# Patient Record
Sex: Male | Born: 1955 | Race: White | Hispanic: No | Marital: Married | Smoking: Never smoker
Health system: Southern US, Community
[De-identification: ages and names within clinical notes are randomized; demographics above are authoritative.]

## PROBLEM LIST (undated history)

## (undated) DIAGNOSIS — E7800 Pure hypercholesterolemia, unspecified: Secondary | ICD-10-CM

## (undated) DIAGNOSIS — I639 Cerebral infarction, unspecified: Secondary | ICD-10-CM

## (undated) DIAGNOSIS — I219 Acute myocardial infarction, unspecified: Secondary | ICD-10-CM

## (undated) DIAGNOSIS — I2510 Atherosclerotic heart disease of native coronary artery without angina pectoris: Secondary | ICD-10-CM

## (undated) DIAGNOSIS — I10 Essential (primary) hypertension: Secondary | ICD-10-CM

## (undated) HISTORY — PX: PACEMAKER IMPLANT: EP1218

## (undated) HISTORY — PX: CORONARY ARTERY BYPASS GRAFT: SHX141

---
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
## 2018-09-12 ENCOUNTER — Emergency Department (HOSPITAL_COMMUNITY)
Admission: EM | Admit: 2018-09-12 | Discharge: 2018-09-12 | Disposition: A | Discharge status: Home / Self Care | Payer: Medicaid Other | Attending: Emergency Medicine | Admitting: Emergency Medicine

## 2018-09-12 ENCOUNTER — Encounter (HOSPITAL_COMMUNITY): Payer: Self-pay

## 2018-09-12 ENCOUNTER — Emergency Department (HOSPITAL_COMMUNITY): Payer: Medicaid Other

## 2018-09-12 DIAGNOSIS — N419 Inflammatory disease of prostate, unspecified: Secondary | ICD-10-CM | POA: Insufficient documentation

## 2018-09-12 DIAGNOSIS — N50811 Right testicular pain: Secondary | ICD-10-CM

## 2018-09-12 DIAGNOSIS — Z951 Presence of aortocoronary bypass graft: Secondary | ICD-10-CM | POA: Diagnosis not present

## 2018-09-12 DIAGNOSIS — R300 Dysuria: Secondary | ICD-10-CM | POA: Diagnosis not present

## 2018-09-12 DIAGNOSIS — I10 Essential (primary) hypertension: Secondary | ICD-10-CM | POA: Diagnosis not present

## 2018-09-12 DIAGNOSIS — R1030 Lower abdominal pain, unspecified: Secondary | ICD-10-CM

## 2018-09-12 DIAGNOSIS — I252 Old myocardial infarction: Secondary | ICD-10-CM | POA: Insufficient documentation

## 2018-09-12 DIAGNOSIS — N433 Hydrocele, unspecified: Secondary | ICD-10-CM

## 2018-09-12 HISTORY — DX: Acute myocardial infarction, unspecified: I21.9

## 2018-09-12 HISTORY — DX: Essential (primary) hypertension: I10

## 2018-09-12 HISTORY — DX: Cerebral infarction, unspecified: I63.9

## 2018-09-12 LAB — CBC WITH DIFFERENTIAL/PLATELET
Abs Immature Granulocytes: 0.04 10*3/uL (ref 0.00–0.07)
BASOS PCT: 0 %
Basophils Absolute: 0 10*3/uL (ref 0.0–0.1)
Eosinophils Absolute: 0.1 10*3/uL (ref 0.0–0.5)
Eosinophils Relative: 1 %
HCT: 40.6 % (ref 39.0–52.0)
Hemoglobin: 13.6 g/dL (ref 13.0–17.0)
Immature Granulocytes: 1 %
LYMPHS ABS: 2.3 10*3/uL (ref 0.7–4.0)
Lymphocytes Relative: 32 %
MCH: 33.7 pg (ref 26.0–34.0)
MCHC: 33.5 g/dL (ref 30.0–36.0)
MCV: 100.5 fL — AB (ref 80.0–100.0)
MONOS PCT: 8 %
Monocytes Absolute: 0.6 10*3/uL (ref 0.1–1.0)
Neutro Abs: 4.2 10*3/uL (ref 1.7–7.7)
Neutrophils Relative %: 58 %
Platelets: 238 10*3/uL (ref 150–400)
RBC: 4.04 MIL/uL — ABNORMAL LOW (ref 4.22–5.81)
RDW: 12.6 % (ref 11.5–15.5)
WBC: 7.3 10*3/uL (ref 4.0–10.5)
nRBC: 0 % (ref 0.0–0.2)

## 2018-09-12 LAB — COMPREHENSIVE METABOLIC PANEL
ALBUMIN: 4.1 g/dL (ref 3.5–5.0)
ALT: 15 U/L (ref 0–44)
ANION GAP: 8 (ref 5–15)
AST: 20 U/L (ref 15–41)
Alkaline Phosphatase: 35 U/L — ABNORMAL LOW (ref 38–126)
BUN: 16 mg/dL (ref 8–23)
CO2: 22 mmol/L (ref 22–32)
Calcium: 9.6 mg/dL (ref 8.9–10.3)
Chloride: 111 mmol/L (ref 98–111)
Creatinine, Ser: 0.96 mg/dL (ref 0.61–1.24)
GFR calc Af Amer: >60 mL/min (ref >60)
GFR calc non Af Amer: >60 mL/min (ref >60)
GLUCOSE: 105 mg/dL — AB (ref 70–99)
Potassium: 3.6 mmol/L (ref 3.5–5.1)
SODIUM: 141 mmol/L (ref 135–145)
Total Bilirubin: 0.6 mg/dL (ref 0.3–1.2)
Total Protein: 6.7 g/dL (ref 6.5–8.1)

## 2018-09-12 LAB — URINALYSIS, ROUTINE W REFLEX MICROSCOPIC
Bilirubin Urine: NEGATIVE
Glucose, UA: NEGATIVE mg/dL
Hgb urine dipstick: NEGATIVE
KETONES UR: NEGATIVE mg/dL
LEUKOCYTES UA: NEGATIVE
Nitrite: NEGATIVE
Protein, ur: NEGATIVE mg/dL
Specific Gravity, Urine: 1.013 (ref 1.005–1.030)
pH: 5 (ref 5.0–8.0)

## 2018-09-12 MED ORDER — CIPROFLOXACIN HCL 500 MG PO TABS
500.0000 mg | ORAL_TABLET | Freq: Once | ORAL | Status: AC
  Administered 2018-09-12: 500 mg via ORAL
  Filled 2018-09-12: qty 1

## 2018-09-12 MED ORDER — CIPROFLOXACIN HCL 500 MG PO TABS: 500.0000 mg | ORAL_TABLET | Freq: Two times a day (BID) | ORAL | 0 refills | Status: AC

## 2018-09-12 NOTE — ED Triage Notes (Signed)
Pt endorses going pain that began yesterday while working. Denies swelling. Endorses burning with urination. VSS.

## 2018-09-12 NOTE — ED Provider Notes (Signed)
[facility] EMERGENCY [facility]PART[facility] Provider Note   CSN: [id number] Arrival date & time: 09/12/18  1541     History   Chief Complaint Chief Complaint  Patient presents with  . Abdominal Pain  . Groin Pain    HPI Chad FusiDouglas Gray is a 62 y.o. male with a PMHx of nephrolithiasis, HTN, HLD, CVA, and MI s/p CABG, who presents to the ED with complaints of groin/lower abdominal pain that began 2 days ago but has worsened today.  He describes the pain as 6/10 constant aching suprapubic pain that radiates to bilateral testicles, worse with movement, unchanged with testicular elevation, and mildly improved with Tylenol.  He reports dribbling urine stream, urinary frequency with sensation of needing to urinate but very little comes out, and dysuria.  He has had kidney stones before but states that this does not feel similar to that.  He denies fevers, chills, CP, SOB, nausea/vomiting, diarrhea/constipation, obstipation, melena, hematochezia, rectal pain, perineal pain, hematuria, testicular swelling, penile discharge, myalgias, arthralgias, numbness, tingling, focal weakness, or any other complaints at this time. Denies recent travel, sick contacts, suspicious food intake, EtOH use, or NSAID use.   The history is provided by the patient and medical records. No language interpreter was used.  Abdominal Pain   Associated symptoms include dysuria and frequency. Pertinent negatives include fever, diarrhea, nausea, vomiting, constipation, hematuria, arthralgias and myalgias.  Groin Pain  Associated symptoms include abdominal pain. Pertinent negatives include no chest pain and no shortness of breath.    Past Medical History:  Diagnosis Date  . Hypertension   . Myocardial infarct (HCC)   . Stroke [facility](HCC)     There are no active problems to display for this patient.   Past Surgical History:  Procedure Laterality Date  . CORONARY ARTERY BYPASS GRAFT          Home Medications     Prior to Admission medications   Not on File    Family History History reviewed. No pertinent family history.  Social History Social History   Tobacco Use  . Smoking status: Never Smoker  Substance Use Topics  . Alcohol use: Never    Frequency: Never  . Drug use: Never     Allergies   Patient has no allergy information on record.   Review of Systems Review of Systems  Constitutional: Negative for chills and fever.  Respiratory: Negative for shortness of breath.   Cardiovascular: Negative for chest pain.  Gastrointestinal: Positive for abdominal pain. Negative for blood in stool, constipation, diarrhea, nausea, rectal pain and vomiting.  Genitourinary: Positive for dysuria, frequency and testicular pain. Negative for discharge, hematuria and scrotal swelling.       +dribbling urine  Musculoskeletal: Negative for arthralgias and myalgias.  Skin: Negative for color change.  Allergic/Immunologic: Negative for immunocompromised state.  Neurological: Negative for weakness and numbness.  Psychiatric/Behavioral: Negative for confusion.   All other systems reviewed and are negative for acute change except as noted in the HPI.    Physical Exam Updated Vital Signs BP (!) 142/88 (BP Location: Right Arm)   Pulse 68   Temp 98 F (36.7 C) (Oral)   Resp 16   SpO2 98%   Physical Exam Vitals signs and nursing note reviewed.  Constitutional:      General: He is not in acute distress.    Appearance: Normal appearance. He is well-developed. He is not toxic-appearing.     Comments: Afebrile, nontoxic, NAD  HENT:     Head:  Normocephalic and atraumatic.  Eyes:     General:        Right eye: No discharge.        Left eye: No discharge.     Conjunctiva/sclera: Conjunctivae normal.  Neck:     Musculoskeletal: Normal range of motion and neck supple.  Cardiovascular:     Rate and Rhythm: Normal rate and regular rhythm.     Heart sounds: Normal heart sounds, S1 normal and S2  normal. No murmur. No friction rub. No gallop.   Pulmonary:     Effort: Pulmonary effort is normal. No respiratory distress.     Breath sounds: Normal breath sounds. No decreased breath sounds, wheezing, rhonchi or rales.  Abdominal:     General: Bowel sounds are normal. There is no distension.     Palpations: Abdomen is soft. Abdomen is not rigid.     Tenderness: There is abdominal tenderness in the suprapubic area. There is no right CVA tenderness, left CVA tenderness, guarding or rebound. Negative signs include Murphy's sign and McBurney's sign.     Hernia: There is no hernia in the right inguinal area or left inguinal area.     Comments: Soft, nondistended, +BS throughout, with mild suprapubic TTP, no r/g/r, neg murphy's, neg mcburney's, no CVA TTP   Genitourinary:    Penis: Normal and circumcised. No phimosis, paraphimosis, hypospadias, erythema, tenderness or discharge.      Scrotum/Testes: Cremasteric reflex is present.        Right: Tenderness present. Mass or swelling not present.        Left: Mass, tenderness or swelling not present.     Comments: Chaperone present for exam Circumcised penis without phimosis/paraphimosis, hypospadias, erythema, tenderness, or discharge. No rashes or lesions. Testes with no masses or swelling, R testicle mildly TTP near the lower pole slightly posteriorly, cremasterics reflex present bilaterally. No abnormal lie. No inguinal hernias or adenopathy present.  Musculoskeletal: Normal range of motion.  Skin:    General: Skin is warm and dry.     Findings: No rash.  Neurological:     Mental Status: He is alert and oriented to person, place, and time.     Sensory: Sensation is intact. No sensory deficit.     Motor: Motor function is intact.  Psychiatric:        Mood and Affect: Mood and affect normal.        Behavior: Behavior normal.      ED Treatments / Results  Labs (all labs ordered are listed, but only abnormal results are displayed) Labs  Reviewed  COMPREHENSIVE METABOLIC PANEL - Abnormal; Notable for the following components:      Result Value   Glucose, Bld 105 (*)    Alkaline Phosphatase 35 (*)    All other components within normal limits  CBC WITH DIFFERENTIAL/PLATELET - Abnormal; Notable for the following components:   RBC 4.04 (*)    MCV 100.5 (*)    All other components within normal limits  URINALYSIS, ROUTINE W REFLEX MICROSCOPIC    EKG None  Radiology US Scrotum W/doppler  Result Date: 09/12/2018 CLINICAL DATA:  Right testicular pain. EXAM: SCROTAL ULTRASOUND DOPPLER ULTRASOUND OF THE TESTICLES TECHNIQUE: Complete ultrasound examination of the testicles, epididymis, and other scrotal structures was performed. Color and spectral Doppler ultrasound were also utilized to evaluate blood flow to the testicles. COMPARISON:  None. FINDINGS: Right testicle Measurements: 4.7 x 3.9 x 2.3 cm. No mass or microlithiasis visualized. Left testicle Measurements: 4.4 x 3.5  x 2.4 cm. No mass or microlithiasis visualized. Right epididymis:  Normal in size and appearance. Left epididymis: 2 cysts are noted, with the largest measuring 1.2 cm. Hydrocele:  Small bilateral hydroceles are noted. Varicocele:  None visualized. Pulsed Doppler interrogation of both testes demonstrates normal low resistance arterial and venous waveforms bilaterally. IMPRESSION: No evidence of testicular mass or torsion. Small bilateral hydroceles are noted. Left epididymal cysts are noted. Electronically Signed   By: Lupita RaiderJames  Green Jr, M.D.   On: 09/12/2018 18:30    Procedures Procedures (including critical care time)  Medications Ordered in ED Medications  ciprofloxacin (CIPRO) tablet 500 mg (500 mg Oral Given 09/12/18 2022)     Initial Impression / Assessment and Plan / ED Course  I have reviewed the triage vital signs and the nursing notes.  Pertinent labs & imaging results that were available during my care of the patient were reviewed by me and  considered in my medical decision making (see chart for details).     62 y.o. male here with lower abdominal/groin pain for the last 2 days worse today.  On exam, very mild suprapubic tenderness, mild right testicular tenderness without swelling, no abnormal lie, cremasterics present bilaterally.  No hernias palpable.  No CVA tenderness.  No other abdominal tenderness.  Differential includes testicular torsion, epididymitis, orchitis, etc.  We will get labs, urine, and ultrasound of scrotum.  Doubt kidney stone. Pt declines wanting anything for pain, will reassess shortly.   8:26 PM CBC w/diff WNL. CMP WNL. U/A unremarkable. US Scrotum w/doppler without evidence of testicular mass or torsion, small b/l hydroceles noted, and L epididymal cysts noted. Post-void residual bladder scan 30mL. Overall symptoms c/w prostatitis, will treat as such. Will send home with cipro, advised tylenol/motrin use, and f/up with urology in 1wk for recheck. I explained the diagnosis and have given explicit precautions to return to the ER including for any other new or worsening symptoms. The patient understands and accepts the medical plan as it's been dictated and I have answered their questions. Discharge instructions concerning home care and prescriptions have been given. The patient is STABLE and is discharged to home in good condition.    Final Clinical Impressions(s) / ED Diagnoses   Final diagnoses:  Testicular pain, right  Lower abdominal pain  Dysuria  Prostatitis, unspecified prostatitis type  Hydrocele of testis    ED Discharge Orders         Ordered    ciprofloxacin (CIPRO) 500 MG tablet  2 times daily     09/12/18 [address]           Rashonda Warrior, JewettMercedes, Cordelia Poche-C 09/12/18 2026    Azalia Bilisampos, Kevin, MD 09/13/18 [phone]

## 2018-09-12 NOTE — Discharge Instructions (Signed)
Stay well hydrated, get plenty of rest. Take antibiotic as directed until completed. Alternate between tylenol and ibuprofen as needed for pain. Follow up with the urologist in 1 week for recheck of symptoms. Return to the ER for emergent changes or worsening symptoms.

## 2018-09-17 ENCOUNTER — Encounter (HOSPITAL_COMMUNITY): Payer: Self-pay | Admitting: Emergency Medicine

## 2018-09-17 ENCOUNTER — Emergency Department (HOSPITAL_COMMUNITY): Payer: Medicaid Other

## 2018-09-17 ENCOUNTER — Emergency Department (HOSPITAL_COMMUNITY)
Admission: EM | Admit: 2018-09-17 | Discharge: 2018-09-17 | Disposition: A | Discharge status: Home / Self Care | Payer: Medicaid Other | Attending: Emergency Medicine | Admitting: Emergency Medicine

## 2018-09-17 ENCOUNTER — Other Ambulatory Visit: Payer: Self-pay

## 2018-09-17 DIAGNOSIS — Z950 Presence of cardiac pacemaker: Secondary | ICD-10-CM | POA: Insufficient documentation

## 2018-09-17 DIAGNOSIS — I10 Essential (primary) hypertension: Secondary | ICD-10-CM | POA: Diagnosis not present

## 2018-09-17 DIAGNOSIS — R05 Cough: Secondary | ICD-10-CM | POA: Diagnosis present

## 2018-09-17 DIAGNOSIS — Z951 Presence of aortocoronary bypass graft: Secondary | ICD-10-CM | POA: Insufficient documentation

## 2018-09-17 DIAGNOSIS — J069 Acute upper respiratory infection, unspecified: Secondary | ICD-10-CM

## 2018-09-17 HISTORY — DX: Pure hypercholesterolemia, unspecified: E78.00

## 2018-09-17 MED ORDER — CETIRIZINE HCL 10 MG PO TABS: 10.0000 mg | ORAL_TABLET | Freq: Every day | ORAL | 0 refills | Status: AC

## 2018-09-17 MED ORDER — FLUTICASONE PROPIONATE 50 MCG/ACT NA SUSP: 2.0000 | Freq: Every day | NASAL | 0 refills | Status: AC

## 2018-09-17 NOTE — ED Notes (Signed)
Patient transported to X-ray 

## 2018-09-17 NOTE — ED Notes (Signed)
Pt back from X-ray.  

## 2018-09-17 NOTE — Discharge Instructions (Signed)
Your chest x-ray did not show any sign of pneumonia.  Drink plenty fluids and get plenty of rest.  Take over-the-counter medicines for your symptoms.  You can take Flonase as needed for nasal congestion.  You can also take allergy medicine such as Zyrtec or Allegra for nasal congestion and scratchy throat.  Follow-up with primary care doctor if symptoms persist.  Return to the emergency department if any concerning signs or symptoms develop such as high fevers, shortness of breath, chest pain, or persistent vomiting.

## 2018-09-17 NOTE — ED Triage Notes (Signed)
Pt reports cough, congestion, body aches, chills. Pt reports fever, but has not measured his temp afebrile here. Unsure of sick contact. Pt reports symptoms x 2-3 days.

## 2018-09-17 NOTE — ED Provider Notes (Signed)
[facility] EMERGENCY DEPARTMENT Provider Note   CSN: [id number] Arrival date & time: 09/17/18  1703     History   Chief Complaint Chief Complaint  Patient presents with  . Influenza    HPI Chad Gray is a 63 y.o. male with history of hyperlipidemia, hypertension, prior MI and stroke presenting for evaluation of acute onset, persistent nasal congestion and dry cough for 2 days.  He notes subjective fevers and chills and sinus pressure.  Denies sore throat but endorses scratchy throat.  Denies shortness of breath or chest pain.  Was seen recently for lower abdominal pain and diagnosed with prostatitis and has been taking Cipro as prescribed with significant improvement in those symptoms.  No nausea or vomiting.  He has tried over-the-counter medications with some relief.  The history is provided by the patient.    Past Medical History:  Diagnosis Date  . Hypercholesteremia   . Hypertension   . Myocardial infarct (HCC)   . Stroke [facility])     There are no active problems to display for this patient.   Past Surgical History:  Procedure Laterality Date  . CORONARY ARTERY BYPASS GRAFT    . PACEMAKER IMPLANT          Home Medications    Prior to Admission medications   Medication Sig Start Date End Date Taking? Authorizing Provider  cetirizine (ZYRTEC ALLERGY) 10 MG tablet Take 1 tablet (10 mg total) by mouth daily. 09/17/18   Keevin Panebianco A, PA-C  ciprofloxacin (CIPRO) 500 MG tablet Take 1 tablet (500 mg total) by mouth 2 (two) times daily for 14 days. 09/12/18 09/26/18  Street, Mercedes, PA-C  fluticasone (FLONASE) 50 MCG/ACT nasal spray Place 2 sprays into both nostrils daily. 09/17/18   Jeanie Sewer, PA-C    Family History History reviewed. No pertinent family history.  Social History Social History   Tobacco Use  . Smoking status: Never Smoker  . Smokeless tobacco: Never Used  Substance Use Topics  . Alcohol use: Never    Frequency: Never    . Drug use: Never     Allergies   Codeine   Review of Systems Review of Systems  Constitutional: Positive for chills and fever.  HENT: Positive for congestion. Negative for sore throat.   Respiratory: Positive for cough. Negative for shortness of breath.   Cardiovascular: Negative for chest pain.  Gastrointestinal: Negative for abdominal pain, nausea and vomiting.     Physical Exam Updated Vital Signs BP (!) 142/79   Pulse 68   Temp (!) 97.4 F (36.3 C) (Oral)   Resp 16   SpO2 98%   Physical Exam Vitals signs and nursing note reviewed.  Constitutional:      General: He is not in acute distress.    Appearance: He is well-developed.  HENT:     Head: Normocephalic and atraumatic.     Right Ear: Tympanic membrane, ear canal and external ear normal.     Left Ear: Tympanic membrane, ear canal and external ear normal.     Nose: Congestion present.     Comments: No frontal or maxillary sinus tenderness    Mouth/Throat:     Mouth: Mucous membranes are moist.     Pharynx: Oropharynx is clear. No oropharyngeal exudate.     Comments: Tolerating secretions without difficulty.  No tonsillar hypertrophy, exudates, uvular deviation, trismus, or sublingual abnormalities Eyes:     General:        Right eye: No discharge.  Left eye: No discharge.     Conjunctiva/sclera: Conjunctivae normal.  Neck:     Musculoskeletal: Normal range of motion. No neck rigidity.     Vascular: No JVD.     Trachea: No tracheal deviation.  Cardiovascular:     Rate and Rhythm: Normal rate.     Heart sounds: Normal heart sounds.  Pulmonary:     Effort: Pulmonary effort is normal.     Breath sounds: Normal breath sounds.  Chest:     Chest wall: No tenderness.  Abdominal:     General: Bowel sounds are normal. There is no distension.     Tenderness: There is no abdominal tenderness. There is no guarding.  Lymphadenopathy:     Cervical: No cervical adenopathy.  Skin:    Findings: No  erythema.  Neurological:     Mental Status: He is alert.  Psychiatric:        Behavior: Behavior normal.      ED Treatments / Results  Labs (all labs ordered are listed, but only abnormal results are displayed) Labs Reviewed - No data to display  EKG None  Radiology Dg Chest 2 View  Result Date: 09/17/2018 CLINICAL DATA:  63 y/o M; body aches, nasal/sinus congestion, 2 days of headaches. EXAM: CHEST - 2 VIEW COMPARISON:  None. FINDINGS: Normal cardiac silhouette. Aortic atherosclerosis with calcification. Two lead pacemaker. Status post CABG with sternotomy wires in alignment. No pleural effusion or pneumothorax. No acute osseous abnormality is evident. IMPRESSION: No acute pulmonary process identified. Electronically Signed   By: Mitzi HansenLance  Furusawa-Stratton M.D.   On: 09/17/2018 20:10    Procedures Procedures (including critical care time)  Medications Ordered in ED Medications - No data to display   Initial Impression / Assessment and Plan / ED Course  I have reviewed the triage vital signs and the nursing notes.  Pertinent labs & imaging results that were available during my care of the patient were reviewed by me and considered in my medical decision making (see chart for details).     Patient presenting for evaluation of nasal congestion, nonproductive cough for 2 days.  Denies chest pain or shortness of breath.  He is afebrile, vital signs stable.  He is nontoxic in appearance.  Symptoms sound infectious in etiology, low suspicion of ACS/MI, PE, or dissection.  We discussed the utility of Tamiflu and he would not want treatment for this even if he did test positive for influenza so we will manage his therapy symptomatically.  Chest x-ray shows no evidence of acute cardiopulmonary abnormality.  No evidence of pneumonia or pleural effusion.  Recommend follow-up with PCP for reevaluation of symptoms.  Discussed strict ED return precautions. Pt verbalized understanding of and  agreement with plan and is safe for discharge home at this time.   Final Clinical Impressions(s) / ED Diagnoses   Final diagnoses:  URI with cough and congestion    ED Discharge Orders         Ordered    fluticasone (FLONASE) 50 MCG/ACT nasal spray  Daily     09/17/18 2020    cetirizine (ZYRTEC ALLERGY) 10 MG tablet  Daily     09/17/18 2020           Jeanie SewerFawze, Bryden Darden A, PA-C 09/18/18 1505    Rolan BuccoBelfi, Melanie, MD 09/19/18 0800

## 2018-09-17 NOTE — ED Notes (Signed)
Discharge instructions and prescriptions discussed with Pt. Pt verbalized understanding. Pt stable and ambulatory.   

## 2020-06-07 ENCOUNTER — Emergency Department (HOSPITAL_COMMUNITY): Payer: Medicare Other | Admitting: Certified Registered Nurse Anesthetist

## 2020-06-07 ENCOUNTER — Emergency Department (HOSPITAL_COMMUNITY): Payer: Medicare Other

## 2020-06-07 ENCOUNTER — Encounter (HOSPITAL_COMMUNITY): Payer: Self-pay | Admitting: Emergency Medicine

## 2020-06-07 ENCOUNTER — Inpatient Hospital Stay (HOSPITAL_COMMUNITY)
Admission: EM | Admit: 2020-06-07 | Discharge: 2020-06-23 | DRG: 026 | Disposition: A | Discharge status: Skilled Nursing Facility | Payer: Medicare Other | Attending: General Surgery | Admitting: General Surgery

## 2020-06-07 ENCOUNTER — Inpatient Hospital Stay (HOSPITAL_COMMUNITY): Admission: EM | Disposition: A | Discharge status: Skilled Nursing Facility | Payer: Self-pay | Source: Home / Self Care

## 2020-06-07 DIAGNOSIS — Z4659 Encounter for fitting and adjustment of other gastrointestinal appliance and device: Secondary | ICD-10-CM

## 2020-06-07 DIAGNOSIS — S0990XA Unspecified injury of head, initial encounter: Secondary | ICD-10-CM

## 2020-06-07 DIAGNOSIS — Z950 Presence of cardiac pacemaker: Secondary | ICD-10-CM | POA: Diagnosis not present

## 2020-06-07 DIAGNOSIS — S064X9A Epidural hemorrhage with loss of consciousness of unspecified duration, initial encounter: Secondary | ICD-10-CM

## 2020-06-07 DIAGNOSIS — S066X0A Traumatic subarachnoid hemorrhage without loss of consciousness, initial encounter: Secondary | ICD-10-CM | POA: Diagnosis present

## 2020-06-07 DIAGNOSIS — D62 Acute posthemorrhagic anemia: Secondary | ICD-10-CM | POA: Diagnosis not present

## 2020-06-07 DIAGNOSIS — W2111XD Struck by baseball bat, subsequent encounter: Secondary | ICD-10-CM | POA: Diagnosis not present

## 2020-06-07 DIAGNOSIS — I10 Essential (primary) hypertension: Secondary | ICD-10-CM | POA: Diagnosis not present

## 2020-06-07 DIAGNOSIS — Z951 Presence of aortocoronary bypass graft: Secondary | ICD-10-CM

## 2020-06-07 DIAGNOSIS — Z9889 Other specified postprocedural states: Secondary | ICD-10-CM

## 2020-06-07 DIAGNOSIS — R0602 Shortness of breath: Secondary | ICD-10-CM

## 2020-06-07 DIAGNOSIS — I2510 Atherosclerotic heart disease of native coronary artery without angina pectoris: Secondary | ICD-10-CM | POA: Diagnosis not present

## 2020-06-07 DIAGNOSIS — R402232 Coma scale, best verbal response, inappropriate words, at arrival to emergency department: Secondary | ICD-10-CM | POA: Diagnosis present

## 2020-06-07 DIAGNOSIS — I69320 Aphasia following cerebral infarction: Secondary | ICD-10-CM | POA: Diagnosis not present

## 2020-06-07 DIAGNOSIS — I959 Hypotension, unspecified: Secondary | ICD-10-CM | POA: Diagnosis not present

## 2020-06-07 DIAGNOSIS — R402342 Coma scale, best motor response, flexion withdrawal, at arrival to emergency department: Secondary | ICD-10-CM | POA: Diagnosis present

## 2020-06-07 DIAGNOSIS — Z20822 Contact with and (suspected) exposure to covid-19: Secondary | ICD-10-CM | POA: Diagnosis not present

## 2020-06-07 DIAGNOSIS — Z7902 Long term (current) use of antithrombotics/antiplatelets: Secondary | ICD-10-CM | POA: Diagnosis not present

## 2020-06-07 DIAGNOSIS — S0101XD Laceration without foreign body of scalp, subsequent encounter: Secondary | ICD-10-CM | POA: Diagnosis present

## 2020-06-07 DIAGNOSIS — Z79899 Other long term (current) drug therapy: Secondary | ICD-10-CM | POA: Diagnosis not present

## 2020-06-07 DIAGNOSIS — E876 Hypokalemia: Secondary | ICD-10-CM | POA: Diagnosis not present

## 2020-06-07 DIAGNOSIS — G8191 Hemiplegia, unspecified affecting right dominant side: Secondary | ICD-10-CM | POA: Diagnosis not present

## 2020-06-07 DIAGNOSIS — R402142 Coma scale, eyes open, spontaneous, at arrival to emergency department: Secondary | ICD-10-CM | POA: Diagnosis present

## 2020-06-07 DIAGNOSIS — Z885 Allergy status to narcotic agent status: Secondary | ICD-10-CM

## 2020-06-07 DIAGNOSIS — I609 Nontraumatic subarachnoid hemorrhage, unspecified: Secondary | ICD-10-CM | POA: Diagnosis not present

## 2020-06-07 DIAGNOSIS — K5900 Constipation, unspecified: Secondary | ICD-10-CM | POA: Diagnosis not present

## 2020-06-07 DIAGNOSIS — F6381 Intermittent explosive disorder: Secondary | ICD-10-CM | POA: Diagnosis not present

## 2020-06-07 DIAGNOSIS — S064X0A Epidural hemorrhage without loss of consciousness, initial encounter: Secondary | ICD-10-CM | POA: Diagnosis not present

## 2020-06-07 DIAGNOSIS — S020XXB Fracture of vault of skull, initial encounter for open fracture: Secondary | ICD-10-CM | POA: Diagnosis not present

## 2020-06-07 DIAGNOSIS — Z23 Encounter for immunization: Secondary | ICD-10-CM

## 2020-06-07 DIAGNOSIS — S064XAA Epidural hemorrhage with loss of consciousness status unknown, initial encounter: Secondary | ICD-10-CM

## 2020-06-07 DIAGNOSIS — Z87820 Personal history of traumatic brain injury: Secondary | ICD-10-CM | POA: Diagnosis not present

## 2020-06-07 DIAGNOSIS — Z4802 Encounter for removal of sutures: Secondary | ICD-10-CM | POA: Diagnosis not present

## 2020-06-07 HISTORY — DX: Unspecified injury of head, initial encounter: S09.90XA

## 2020-06-07 HISTORY — DX: Atherosclerotic heart disease of native coronary artery without angina pectoris: I25.10

## 2020-06-07 HISTORY — PX: CRANIOTOMY: SHX93

## 2020-06-07 LAB — I-STAT CHEM 8, ED
BUN: 10 mg/dL (ref 8–23)
Calcium, Ion: 1.27 mmol/L (ref 1.15–1.40)
Chloride: 106 mmol/L (ref 98–111)
Creatinine, Ser: 1.1 mg/dL (ref 0.61–1.24)
Glucose, Bld: 104 mg/dL — ABNORMAL HIGH (ref 70–99)
HCT: 39 % (ref 39.0–52.0)
Hemoglobin: 13.3 g/dL (ref 13.0–17.0)
Potassium: 3.9 mmol/L (ref 3.5–5.1)
Sodium: 140 mmol/L (ref 135–145)
TCO2: 24 mmol/L (ref 22–32)

## 2020-06-07 LAB — SARS CORONAVIRUS 2 BY RT PCR (HOSPITAL ORDER, PERFORMED IN ~~LOC~~ HOSPITAL LAB): SARS Coronavirus 2: NEGATIVE

## 2020-06-07 LAB — PROTIME-INR
INR: 1.1 (ref 0.8–1.2)
Prothrombin Time: 13.3 seconds (ref 11.4–15.2)

## 2020-06-07 LAB — COMPREHENSIVE METABOLIC PANEL
ALT: 15 U/L (ref 0–44)
AST: 19 U/L (ref 15–41)
Albumin: 4 g/dL (ref 3.5–5.0)
Alkaline Phosphatase: 33 U/L — ABNORMAL LOW (ref 38–126)
Anion gap: 7 (ref 5–15)
BUN: 9 mg/dL (ref 8–23)
CO2: 21 mmol/L — ABNORMAL LOW (ref 22–32)
Calcium: 9.5 mg/dL (ref 8.9–10.3)
Chloride: 109 mmol/L (ref 98–111)
Creatinine, Ser: 1.12 mg/dL (ref 0.61–1.24)
GFR calc Af Amer: >60 mL/min (ref >60)
GFR calc non Af Amer: >60 mL/min (ref >60)
Glucose, Bld: 108 mg/dL — ABNORMAL HIGH (ref 70–99)
Potassium: 3.9 mmol/L (ref 3.5–5.1)
Sodium: 137 mmol/L (ref 135–145)
Total Bilirubin: 0.9 mg/dL (ref 0.3–1.2)
Total Protein: 6.3 g/dL — ABNORMAL LOW (ref 6.5–8.1)

## 2020-06-07 LAB — CBC
HCT: 40 % (ref 39.0–52.0)
Hemoglobin: 13.2 g/dL (ref 13.0–17.0)
MCH: 34.1 pg — ABNORMAL HIGH (ref 26.0–34.0)
MCHC: 33 g/dL (ref 30.0–36.0)
MCV: 103.4 fL — ABNORMAL HIGH (ref 80.0–100.0)
Platelets: 239 10*3/uL (ref 150–400)
RBC: 3.87 MIL/uL — ABNORMAL LOW (ref 4.22–5.81)
RDW: 12.8 % (ref 11.5–15.5)
WBC: 11 10*3/uL — ABNORMAL HIGH (ref 4.0–10.5)
nRBC: 0 % (ref 0.0–0.2)

## 2020-06-07 LAB — TYPE AND SCREEN
ABO/RH(D): A POS
Antibody Screen: NEGATIVE

## 2020-06-07 LAB — SAMPLE TO BLOOD BANK

## 2020-06-07 LAB — ETHANOL: Alcohol, Ethyl (B): <10 mg/dL (ref <10)

## 2020-06-07 LAB — ABO/RH: ABO/RH(D): A POS

## 2020-06-07 LAB — LACTIC ACID, PLASMA: Lactic Acid, Venous: 1.5 mmol/L (ref 0.5–1.9)

## 2020-06-07 LAB — MRSA PCR SCREENING: MRSA by PCR: POSITIVE — AB

## 2020-06-07 SURGERY — CRANIOTOMY HEMATOMA EVACUATION EPIDURAL
Anesthesia: General | Laterality: Left

## 2020-06-07 MED ORDER — TETANUS-DIPHTH-ACELL PERTUSSIS 5-2.5-18.5 LF-MCG/0.5 IM SUSP
0.5000 mL | Freq: Once | INTRAMUSCULAR | Status: AC
  Administered 2020-06-07: 0.5 mL via INTRAMUSCULAR

## 2020-06-07 MED ORDER — DEXAMETHASONE SODIUM PHOSPHATE 10 MG/ML IJ SOLN
INTRAMUSCULAR | Status: DC | PRN
  Administered 2020-06-07: 10 mg via INTRAVENOUS

## 2020-06-07 MED ORDER — THIAMINE HCL 100 MG PO TABS: 100.0000 mg | ORAL_TABLET | Freq: Every day | ORAL | Status: DC

## 2020-06-07 MED ORDER — FENTANYL CITRATE (PF) 100 MCG/2ML IJ SOLN
INTRAMUSCULAR | Status: AC
Start: 2020-06-07 — End: 2020-06-08
  Filled 2020-06-07: qty 2

## 2020-06-07 MED ORDER — ONDANSETRON HCL 4 MG/2ML IJ SOLN: 4.0000 mg | Freq: Four times a day (QID) | INTRAMUSCULAR | Status: DC | PRN

## 2020-06-07 MED ORDER — BACITRACIN ZINC 500 UNIT/GM EX OINT
TOPICAL_OINTMENT | CUTANEOUS | Status: AC
  Filled 2020-06-07: qty 28.35

## 2020-06-07 MED ORDER — POLYETHYLENE GLYCOL 3350 17 G PO PACK: 17.0000 g | PACK | Freq: Every day | ORAL | Status: DC

## 2020-06-07 MED ORDER — LACTATED RINGERS IV SOLN: INTRAVENOUS | Status: DC

## 2020-06-07 MED ORDER — SUCCINYLCHOLINE CHLORIDE 200 MG/10ML IV SOSY
PREFILLED_SYRINGE | INTRAVENOUS | Status: DC | PRN
  Administered 2020-06-07: 120 mg via INTRAVENOUS

## 2020-06-07 MED ORDER — THROMBIN 20000 UNITS EX SOLR
CUTANEOUS | Status: AC
  Filled 2020-06-07: qty 20000

## 2020-06-07 MED ORDER — 0.9 % SODIUM CHLORIDE (POUR BTL) OPTIME
TOPICAL | Status: DC | PRN
  Administered 2020-06-07: 1000 mL

## 2020-06-07 MED ORDER — THROMBIN 20000 UNITS EX SOLR
CUTANEOUS | Status: DC | PRN
  Administered 2020-06-07: 20 mL via TOPICAL

## 2020-06-07 MED ORDER — LACTATED RINGERS IV SOLN: INTRAVENOUS | Status: DC | PRN

## 2020-06-07 MED ORDER — DEXMEDETOMIDINE HCL IN NACL 200 MCG/50ML IV SOLN
0.4000 ug/kg/h | INTRAVENOUS | Status: DC
  Filled 2020-06-07: qty 50

## 2020-06-07 MED ORDER — SODIUM CHLORIDE 0.9 % IV SOLN
2.0000 g | Freq: Once | INTRAVENOUS | Status: AC
  Administered 2020-06-07: 2 g via INTRAVENOUS
  Filled 2020-06-07: qty 20

## 2020-06-07 MED ORDER — CEFAZOLIN SODIUM-DEXTROSE 2-4 GM/100ML-% IV SOLN: 2.0000 g | Freq: Once | INTRAVENOUS | Status: DC

## 2020-06-07 MED ORDER — SODIUM CHLORIDE 0.9 % IV SOLN: INTRAVENOUS | Status: DC | PRN

## 2020-06-07 MED ORDER — ADULT MULTIVITAMIN W/MINERALS CH: 1.0000 | ORAL_TABLET | Freq: Every day | ORAL | Status: DC

## 2020-06-07 MED ORDER — SUGAMMADEX SODIUM 200 MG/2ML IV SOLN
INTRAVENOUS | Status: DC | PRN
  Administered 2020-06-07: 300 mg via INTRAVENOUS

## 2020-06-07 MED ORDER — LIDOCAINE-EPINEPHRINE 1 %-1:100000 IJ SOLN
INTRAMUSCULAR | Status: AC
  Filled 2020-06-07: qty 1

## 2020-06-07 MED ORDER — PROPOFOL 10 MG/ML IV BOLUS
INTRAVENOUS | Status: DC | PRN
  Administered 2020-06-07: 50 mg via INTRAVENOUS
  Administered 2020-06-07: 150 mg via INTRAVENOUS
  Administered 2020-06-07: 100 mg via INTRAVENOUS
  Administered 2020-06-07: 50 mg via INTRAVENOUS

## 2020-06-07 MED ORDER — SODIUM CHLORIDE 0.9% IV SOLUTION: Freq: Once | INTRAVENOUS | Status: DC

## 2020-06-07 MED ORDER — NOREPINEPHRINE 4 MG/250ML-% IV SOLN
INTRAVENOUS | Status: AC
  Administered 2020-06-07: 4 mg
  Filled 2020-06-07: qty 250

## 2020-06-07 MED ORDER — THIAMINE HCL 100 MG/ML IJ SOLN: 100.0000 mg | Freq: Every day | INTRAMUSCULAR | Status: DC

## 2020-06-07 MED ORDER — FENTANYL 2500MCG IN NS 250ML (10MCG/ML) PREMIX INFUSION: 25.0000 ug/h | INTRAVENOUS | Status: DC

## 2020-06-07 MED ORDER — CEFAZOLIN SODIUM-DEXTROSE 2-3 GM-%(50ML) IV SOLR
INTRAVENOUS | Status: DC | PRN
  Administered 2020-06-07: 2 g via INTRAVENOUS

## 2020-06-07 MED ORDER — FOLIC ACID 1 MG PO TABS: 1.0000 mg | ORAL_TABLET | Freq: Every day | ORAL | Status: DC

## 2020-06-07 MED ORDER — CLEVIDIPINE BUTYRATE 0.5 MG/ML IV EMUL
0.0000 mg/h | INTRAVENOUS | Status: DC
  Administered 2020-06-07: 1 mg/h via INTRAVENOUS
  Filled 2020-06-07 (×2): qty 50

## 2020-06-07 MED ORDER — IOHEXOL 300 MG/ML  SOLN
100.0000 mL | Freq: Once | INTRAMUSCULAR | Status: AC | PRN
  Administered 2020-06-07: 100 mL via INTRAVENOUS

## 2020-06-07 MED ORDER — DEXMEDETOMIDINE HCL IN NACL 400 MCG/100ML IV SOLN
0.4000 ug/kg/h | INTRAVENOUS | Status: DC
  Administered 2020-06-07: 0.6 ug/kg/h via INTRAVENOUS
  Administered 2020-06-07 – 2020-06-08 (×2): 0.4 ug/kg/h via INTRAVENOUS
  Filled 2020-06-07 (×2): qty 100

## 2020-06-07 MED ORDER — LORAZEPAM 1 MG PO TABS: 1.0000 mg | ORAL_TABLET | ORAL | Status: DC | PRN

## 2020-06-07 MED ORDER — LIDOCAINE-EPINEPHRINE 1 %-1:100000 IJ SOLN
20.0000 mL | Freq: Once | INTRAMUSCULAR | Status: AC
  Administered 2020-06-07: 20 mL

## 2020-06-07 MED ORDER — FENTANYL CITRATE (PF) 250 MCG/5ML IJ SOLN
INTRAMUSCULAR | Status: DC | PRN
Start: 2020-06-07 — End: 2020-06-07
  Administered 2020-06-07: 100 ug via INTRAVENOUS
  Administered 2020-06-07: 150 ug via INTRAVENOUS

## 2020-06-07 MED ORDER — DEXMEDETOMIDINE HCL IN NACL 80 MCG/20ML IV SOLN
INTRAVENOUS | Status: AC
  Filled 2020-06-07: qty 20

## 2020-06-07 MED ORDER — THROMBIN (RECOMBINANT) 5000 UNITS EX SOLR
CUTANEOUS | Status: AC
  Filled 2020-06-07: qty 5000

## 2020-06-07 MED ORDER — PROPOFOL 10 MG/ML IV BOLUS
INTRAVENOUS | Status: AC
  Filled 2020-06-07: qty 20

## 2020-06-07 MED ORDER — FENTANYL CITRATE (PF) 250 MCG/5ML IJ SOLN
INTRAMUSCULAR | Status: AC
  Filled 2020-06-07: qty 5

## 2020-06-07 MED ORDER — ONDANSETRON HCL 4 MG/2ML IJ SOLN
INTRAMUSCULAR | Status: DC | PRN
  Administered 2020-06-07: 4 mg via INTRAVENOUS

## 2020-06-07 MED ORDER — FENTANYL CITRATE (PF) 100 MCG/2ML IJ SOLN
25.0000 ug | INTRAMUSCULAR | Status: DC | PRN
  Administered 2020-06-07: 50 ug via INTRAVENOUS

## 2020-06-07 MED ORDER — CEFAZOLIN SODIUM-DEXTROSE 1-4 GM/50ML-% IV SOLN
1.0000 g | Freq: Three times a day (TID) | INTRAVENOUS | Status: DC
  Administered 2020-06-08 – 2020-06-09 (×4): 1 g via INTRAVENOUS
  Filled 2020-06-07 (×4): qty 50

## 2020-06-07 MED ORDER — ONDANSETRON HCL 4 MG/2ML IJ SOLN
INTRAMUSCULAR | Status: AC
  Filled 2020-06-07: qty 2

## 2020-06-07 MED ORDER — DEXAMETHASONE SODIUM PHOSPHATE 10 MG/ML IJ SOLN
INTRAMUSCULAR | Status: AC
  Filled 2020-06-07: qty 1

## 2020-06-07 MED ORDER — HYDRALAZINE HCL 20 MG/ML IJ SOLN
10.0000 mg | INTRAMUSCULAR | Status: DC | PRN
  Administered 2020-06-09: 10 mg via INTRAVENOUS
  Filled 2020-06-07: qty 1

## 2020-06-07 MED ORDER — ROCURONIUM BROMIDE 10 MG/ML (PF) SYRINGE
PREFILLED_SYRINGE | INTRAVENOUS | Status: DC | PRN
  Administered 2020-06-07: 70 mg via INTRAVENOUS
  Administered 2020-06-07: 10 mg via INTRAVENOUS

## 2020-06-07 MED ORDER — FENTANYL BOLUS VIA INFUSION
25.0000 ug | INTRAVENOUS | Status: DC | PRN
  Filled 2020-06-07: qty 25

## 2020-06-07 MED ORDER — LIDOCAINE 2% (20 MG/ML) 5 ML SYRINGE
INTRAMUSCULAR | Status: DC | PRN
  Administered 2020-06-07: 40 mg via INTRAVENOUS

## 2020-06-07 MED ORDER — NOREPINEPHRINE 4 MG/250ML-% IV SOLN
0.0000 ug/min | INTRAVENOUS | Status: DC
  Administered 2020-06-08: 10 ug/min via INTRAVENOUS
  Administered 2020-06-08: 2 ug/min via INTRAVENOUS

## 2020-06-07 MED ORDER — LIDOCAINE-EPINEPHRINE 1 %-1:100000 IJ SOLN
INTRAMUSCULAR | Status: DC | PRN
  Administered 2020-06-07: 10 mL

## 2020-06-07 MED ORDER — PROPOFOL 1000 MG/100ML IV EMUL: 0.0000 ug/kg/min | INTRAVENOUS | Status: DC

## 2020-06-07 MED ORDER — FENTANYL CITRATE (PF) 100 MCG/2ML IJ SOLN: 25.0000 ug | Freq: Once | INTRAMUSCULAR | Status: DC

## 2020-06-07 MED ORDER — THROMBIN 5000 UNITS EX SOLR
CUTANEOUS | Status: AC
  Filled 2020-06-07: qty 5000

## 2020-06-07 MED ORDER — THROMBIN 5000 UNITS EX SOLR: OROMUCOSAL | Status: DC | PRN

## 2020-06-07 MED ORDER — LORAZEPAM 2 MG/ML IJ SOLN: 1.0000 mg | INTRAMUSCULAR | Status: DC | PRN

## 2020-06-07 MED ORDER — DOCUSATE SODIUM 50 MG/5ML PO LIQD
100.0000 mg | Freq: Two times a day (BID) | ORAL | Status: DC
  Filled 2020-06-07: qty 10

## 2020-06-07 MED ORDER — ONDANSETRON HCL 4 MG/2ML IJ SOLN
4.0000 mg | Freq: Four times a day (QID) | INTRAMUSCULAR | Status: DC | PRN
  Administered 2020-06-07: 4 mg via INTRAVENOUS
  Filled 2020-06-07: qty 2

## 2020-06-07 MED ORDER — LABETALOL HCL 5 MG/ML IV SOLN
INTRAVENOUS | Status: DC | PRN
  Administered 2020-06-07 (×2): 5 mg via INTRAVENOUS

## 2020-06-07 MED ORDER — ONDANSETRON 4 MG PO TBDP: 4.0000 mg | ORAL_TABLET | Freq: Four times a day (QID) | ORAL | Status: DC | PRN

## 2020-06-07 SURGICAL SUPPLY — 95 items
ADH SKN CLS APL DERMABOND .7 (GAUZE/BANDAGES/DRESSINGS)
BAG DECANTER FOR FLEXI CONT (MISCELLANEOUS) ×2 IMPLANT
BIT DRILL WIRE PASS 1.3MM (BIT) IMPLANT
BLADE CLIPPER SURG (BLADE) ×4 IMPLANT
BLADE SURG 11 STRL SS (BLADE) ×2 IMPLANT
BNDG CMPR 75X41 PLY HI ABS (GAUZE/BANDAGES/DRESSINGS)
BNDG COHESIVE 4X5 TAN STRL (GAUZE/BANDAGES/DRESSINGS) IMPLANT
BNDG STRETCH 4X75 STRL LF (GAUZE/BANDAGES/DRESSINGS) IMPLANT
BUR ACORN 9.0 PRECISION (BURR) ×2 IMPLANT
BUR SPIRAL ROUTER 2.3 (BUR) ×2 IMPLANT
CABLE BIPOLOR RESECTION CORD (MISCELLANEOUS) IMPLANT
CANISTER SUCT 3000ML PPV (MISCELLANEOUS) ×2 IMPLANT
CARTRIDGE OIL MAESTRO DRILL (MISCELLANEOUS) ×1 IMPLANT
CLIP VESOCCLUDE MED 6/CT (CLIP) IMPLANT
COVER BACK TABLE 60X90IN (DRAPES) IMPLANT
COVER WAND RF STERILE (DRAPES) ×2 IMPLANT
DECANTER SPIKE VIAL GLASS SM (MISCELLANEOUS) ×2 IMPLANT
DERMABOND ADVANCED (GAUZE/BANDAGES/DRESSINGS)
DERMABOND ADVANCED .7 DNX12 (GAUZE/BANDAGES/DRESSINGS) IMPLANT
DIFFUSER DRILL AIR PNEUMATIC (MISCELLANEOUS) ×2 IMPLANT
DRAIN SNY 7 FPER (WOUND CARE) ×2 IMPLANT
DRAPE NEUROLOGICAL W/INCISE (DRAPES) ×2 IMPLANT
DRAPE SURG 17X23 STRL (DRAPES) IMPLANT
DRAPE WARM FLUID 44X44 (DRAPES) ×2 IMPLANT
DRILL WIRE PASS 1.3MM (BIT)
DRSG OPSITE POSTOP 4X6 (GAUZE/BANDAGES/DRESSINGS) ×6 IMPLANT
ELECT BLADE 4.0 EZ CLEAN MEGAD (MISCELLANEOUS) ×2
ELECT REM PT RETURN 9FT ADLT (ELECTROSURGICAL) ×2
ELECTRODE BLDE 4.0 EZ CLN MEGD (MISCELLANEOUS) ×1 IMPLANT
ELECTRODE REM PT RTRN 9FT ADLT (ELECTROSURGICAL) ×1 IMPLANT
EVACUATOR SILICONE 100CC (DRAIN) ×2 IMPLANT
GAUZE 4X4 16PLY RFD (DISPOSABLE) IMPLANT
GAUZE SPONGE 4X4 12PLY STRL (GAUZE/BANDAGES/DRESSINGS) IMPLANT
GAUZE SPONGE 4X4 12PLY STRL LF (GAUZE/BANDAGES/DRESSINGS) ×2 IMPLANT
GLOVE BIO SURGEON STRL SZ 6.5 (GLOVE) IMPLANT
GLOVE BIO SURGEON STRL SZ7 (GLOVE) ×2 IMPLANT
GLOVE BIO SURGEON STRL SZ7.5 (GLOVE) IMPLANT
GLOVE BIO SURGEON STRL SZ8 (GLOVE) ×4 IMPLANT
GLOVE BIO SURGEON STRL SZ8.5 (GLOVE) IMPLANT
GLOVE BIOGEL M 8.0 STRL (GLOVE) IMPLANT
GLOVE BIOGEL PI IND STRL 7.0 (GLOVE) ×1 IMPLANT
GLOVE BIOGEL PI IND STRL 8.5 (GLOVE) ×1 IMPLANT
GLOVE BIOGEL PI INDICATOR 7.0 (GLOVE) ×1
GLOVE BIOGEL PI INDICATOR 8.5 (GLOVE) ×1
GLOVE ECLIPSE 6.5 STRL STRAW (GLOVE) IMPLANT
GLOVE ECLIPSE 7.0 STRL STRAW (GLOVE) IMPLANT
GLOVE ECLIPSE 7.5 STRL STRAW (GLOVE) IMPLANT
GLOVE ECLIPSE 8.0 STRL XLNG CF (GLOVE) IMPLANT
GLOVE ECLIPSE 8.5 STRL (GLOVE) IMPLANT
GLOVE EXAM NITRILE XL STR (GLOVE) IMPLANT
GLOVE INDICATOR 6.5 STRL GRN (GLOVE) ×2 IMPLANT
GLOVE INDICATOR 7.0 STRL GRN (GLOVE) IMPLANT
GLOVE INDICATOR 7.5 STRL GRN (GLOVE) IMPLANT
GLOVE INDICATOR 8.0 STRL GRN (GLOVE) IMPLANT
GLOVE INDICATOR 8.5 STRL (GLOVE) ×4 IMPLANT
GLOVE OPTIFIT SS 8.0 STRL (GLOVE) IMPLANT
GLOVE SURG SS PI 6.0 STRL IVOR (GLOVE) ×4 IMPLANT
GLOVE SURG SS PI 6.5 STRL IVOR (GLOVE) IMPLANT
GOWN STRL REUS W/ TWL LRG LVL3 (GOWN DISPOSABLE) ×1 IMPLANT
GOWN STRL REUS W/ TWL XL LVL3 (GOWN DISPOSABLE) ×1 IMPLANT
GOWN STRL REUS W/TWL 2XL LVL3 (GOWN DISPOSABLE) ×2 IMPLANT
GOWN STRL REUS W/TWL LRG LVL3 (GOWN DISPOSABLE) ×2
GOWN STRL REUS W/TWL XL LVL3 (GOWN DISPOSABLE) ×2
HEMOSTAT POWDER KIT SURGIFOAM (HEMOSTASIS) ×6 IMPLANT
HEMOSTAT SURGICEL 2X14 (HEMOSTASIS) IMPLANT
HOOK DURA (MISCELLANEOUS) ×2 IMPLANT
KIT BASIN OR (CUSTOM PROCEDURE TRAY) ×2 IMPLANT
KIT REMOVER STAPLE SKIN (MISCELLANEOUS) ×2 IMPLANT
KIT TURNOVER KIT B (KITS) ×2 IMPLANT
NEEDLE HYPO 25X1 1.5 SAFETY (NEEDLE) ×2 IMPLANT
NS IRRIG 1000ML POUR BTL (IV SOLUTION) ×6 IMPLANT
OIL CARTRIDGE MAESTRO DRILL (MISCELLANEOUS) ×2
PACK CRANIOTOMY CUSTOM (CUSTOM PROCEDURE TRAY) ×2 IMPLANT
PAD ARMBOARD 7.5X6 YLW CONV (MISCELLANEOUS) ×2 IMPLANT
PATTIES SURGICAL .25X.25 (GAUZE/BANDAGES/DRESSINGS) IMPLANT
PATTIES SURGICAL .5 X.5 (GAUZE/BANDAGES/DRESSINGS) IMPLANT
PATTIES SURGICAL .5 X3 (DISPOSABLE) IMPLANT
PATTIES SURGICAL 1X1 (DISPOSABLE) IMPLANT
PIN MAYFIELD SKULL DISP (PIN) ×2 IMPLANT
PLATE DOUBLE Y CMF 6H (Plate) ×8 IMPLANT
SCREW UNIII AXS SD 1.5X4 (Screw) ×44 IMPLANT
SCREW UNIII AXS SD 1.5X5 (Screw) ×2 IMPLANT
SPONGE NEURO XRAY DETECT 1X3 (DISPOSABLE) IMPLANT
SPONGE SURGIFOAM ABS GEL 100 (HEMOSTASIS) ×2 IMPLANT
STAPLER SKIN PROX WIDE 3.9 (STAPLE) ×2 IMPLANT
STAPLER VISISTAT 35W (STAPLE) ×2 IMPLANT
SUT ETHILON 3 0 FSL (SUTURE) IMPLANT
SUT NURALON 4 0 TR CR/8 (SUTURE) ×4 IMPLANT
SUT VIC AB 2-0 CT1 18 (SUTURE) ×6 IMPLANT
SYR CONTROL 10ML LL (SYRINGE) ×2 IMPLANT
TOWEL GREEN STERILE (TOWEL DISPOSABLE) ×2 IMPLANT
TOWEL GREEN STERILE FF (TOWEL DISPOSABLE) ×2 IMPLANT
TRAY FOLEY MTR SLVR 16FR STAT (SET/KITS/TRAYS/PACK) ×2 IMPLANT
UNDERPAD 30X36 HEAVY ABSORB (UNDERPADS AND DIAPERS) IMPLANT
WATER STERILE IRR 1000ML POUR (IV SOLUTION) ×2 IMPLANT
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
## 2020-06-07 NOTE — Anesthesia Procedure Notes (Signed)
Procedure Name: Intubation Date/Time: 06/07/2020 5:44 PM Performed by: Epifanio Lesches, CRNA Pre-anesthesia Checklist: Patient identified, Emergency Drugs available, Suction available and Patient being monitored Patient Re-evaluated:Patient Re-evaluated prior to induction Oxygen Delivery Method: Circle System Utilized Preoxygenation: Pre-oxygenation with 100% oxygen Induction Type: IV induction Ventilation: Mask ventilation without difficulty Laryngoscope Size: Glidescope and 4 Grade View: Grade I Tube type: Oral Tube size: 7.5 mm Number of attempts: 1 Airway Equipment and Method: Stylet and Oral airway Placement Confirmation: ETT inserted through vocal cords under direct vision,  positive ETCO2 and breath sounds checked- equal and bilateral Secured at: 22 cm Tube secured with: Tape Dental Injury: Teeth and Oropharynx as per pre-operative assessment

## 2020-06-07 NOTE — Transfer of Care (Signed)
Immediate Anesthesia Transfer of Care Note  Patient: Chad Gray  Procedure(s) Performed: CRANIOTOMY HEMATOMA EVACUATION EPIDURAL (Left )  Patient Location: PACU  Anesthesia Type:General  Level of Consciousness: sedated  Airway & Oxygen Therapy: Patient connected to face mask oxygen  Post-op Assessment: Report given to RN and Post -op Vital signs reviewed and stable  Post vital signs: Reviewed and stable  Last Vitals:  Vitals Value Taken Time  BP 156/112 06/07/20 1934  Temp    Pulse 84 06/07/20 1940  Resp 21 06/07/20 1940  SpO2 100 % 06/07/20 1940  Vitals shown include unvalidated device data.  Last Pain:  Vitals:   06/07/20 1617  TempSrc: Temporal         Complications: No complications documented.

## 2020-06-07 NOTE — Anesthesia Procedure Notes (Signed)
Arterial Line Insertion Start/End9/22/2021 5:40 PM, 06/07/2020 5:42 PM Performed by: Drema Pry, CRNA, CRNA  Preanesthetic checklist: patient identified, IV checked, site marked, risks and benefits discussed, surgical consent, monitors and equipment checked, pre-op evaluation, timeout performed and anesthesia consent Right, radial was placed Catheter size: 20 G Hand hygiene performed  and maximum sterile barriers used  Allen's test indicative of satisfactory collateral circulation Attempts: 1 Procedure performed without using ultrasound guided technique. Following insertion, dressing applied and Biopatch. Post procedure assessment: normal  Patient tolerated the procedure well with no immediate complications.

## 2020-06-07 NOTE — Progress Notes (Signed)
   06/07/20 1600  Clinical Encounter Type  Visited With Other (Comment) (Spoke with Unit Secretary)  Visit Type Other (Comment) (Phone call)  Referral From Nurse  Consult/Referral To Chaplain  Spoke with Licensed conveyancer, advised Chaplain received page. Unit Secretary stated patient's family is not present. Advised to call Chaplain if needed. Called Unit Secretary an hour later to follow up on patient to see if Chaplain services are needed, Unit Secretary stated family not present. Advised to call if needed.This note was prepared by Deneen Harts, M.Div..  For questions please contact by phone [phone].
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
## 2020-06-07 NOTE — ED Notes (Signed)
Transported to CT 

## 2020-06-07 NOTE — Anesthesia Preprocedure Evaluation (Addendum)
Anesthesia Evaluation  Patient identified by MRN, date of birth, ID band Patient awake    Reviewed: Allergy & Precautions, NPO status , Patient's Chart, lab work & pertinent test results  Airway Mallampati: II   Neck ROM: full    Dental no notable dental hx.    Pulmonary neg pulmonary ROS,    Pulmonary exam normal breath sounds clear to auscultation       Cardiovascular hypertension, Pt. on medications + CAD  Normal cardiovascular exam Rhythm:Regular Rate:Normal     Neuro/Psych CVA negative psych ROS   GI/Hepatic negative GI ROS, Neg liver ROS,   Endo/Other  negative endocrine ROS  Renal/GU negative Renal ROS  negative genitourinary   Musculoskeletal negative musculoskeletal ROS (+)   Abdominal   Peds negative pediatric ROS (+)  Hematology negative hematology ROS (+)   Anesthesia Other Findings Severe head trauma  Reproductive/Obstetrics negative OB ROS                            Anesthesia Physical Anesthesia Plan  ASA: IV and emergent  Anesthesia Plan: General   Post-op Pain Management:    Induction: Intravenous  PONV Risk Score and Plan: 2 and Ondansetron, Midazolam and Treatment may vary due to age or medical condition  Airway Management Planned: Oral ETT  Additional Equipment: Arterial line  Intra-op Plan:   Post-operative Plan: Post-operative intubation/ventilation  Informed Consent: I have reviewed the patients History and Physical, chart, labs and discussed the procedure including the risks, benefits and alternatives for the proposed anesthesia with the patient or authorized representative who has indicated his/her understanding and acceptance.     Dental advisory given  Plan Discussed with: CRNA  Anesthesia Plan Comments:         Anesthesia Quick Evaluation

## 2020-06-07 NOTE — ED Notes (Signed)
Dr Wynetta Emery arrived [phone]

## 2020-06-07 NOTE — Progress Notes (Signed)
Patient combative upon arrival to PACU.  MAE x 4 but non verbal and not following commands.  Dr. Chaney Malling notified and precedex gtt ordered.  Precedex started at 0.4 mcg/kg/hr.  Will continue to monitor and titrate precedex at needed.
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
## 2020-06-07 NOTE — ED Notes (Signed)
Patient had 1 episode of emesis while in CT. Dr Myrtis Ser to CT to evaluate patient. Patient remains alert but confused, does not follow commands
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
## 2020-06-07 NOTE — ED Notes (Signed)
Taken to OR with Dr Wynetta Emery
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
## 2020-06-08 ENCOUNTER — Inpatient Hospital Stay (HOSPITAL_COMMUNITY): Payer: Medicare Other

## 2020-06-08 ENCOUNTER — Encounter (HOSPITAL_COMMUNITY): Payer: Self-pay

## 2020-06-08 ENCOUNTER — Other Ambulatory Visit: Payer: Self-pay

## 2020-06-08 LAB — CBC
HCT: 28.1 % — ABNORMAL LOW (ref 39.0–52.0)
HCT: 25.9 % — ABNORMAL LOW (ref 39.0–52.0)
Hemoglobin: 9.6 g/dL — ABNORMAL LOW (ref 13.0–17.0)
Hemoglobin: 8.9 g/dL — ABNORMAL LOW (ref 13.0–17.0)
MCH: 34.7 pg — ABNORMAL HIGH (ref 26.0–34.0)
MCH: 34.6 pg — ABNORMAL HIGH (ref 26.0–34.0)
MCHC: 34.2 g/dL (ref 30.0–36.0)
MCHC: 34.4 g/dL (ref 30.0–36.0)
MCV: 101.4 fL — ABNORMAL HIGH (ref 80.0–100.0)
MCV: 100.8 fL — ABNORMAL HIGH (ref 80.0–100.0)
Platelets: 250 10*3/uL (ref 150–400)
Platelets: 270 10*3/uL (ref 150–400)
RBC: 2.77 MIL/uL — ABNORMAL LOW (ref 4.22–5.81)
RBC: 2.57 MIL/uL — ABNORMAL LOW (ref 4.22–5.81)
RDW: 12.6 % (ref 11.5–15.5)
RDW: 12.8 % (ref 11.5–15.5)
WBC: 10.6 10*3/uL — ABNORMAL HIGH (ref 4.0–10.5)
WBC: 17.6 10*3/uL — ABNORMAL HIGH (ref 4.0–10.5)
nRBC: 0 % (ref 0.0–0.2)
nRBC: 0 % (ref 0.0–0.2)

## 2020-06-08 LAB — BPAM PLATELET PHERESIS
Blood Product Expiration Date: 202109242359
Blood Product Expiration Date: 202109242359
ISSUE DATE / TIME: 202109221800
ISSUE DATE / TIME: 202109221800
Unit Type and Rh: 6200
Unit Type and Rh: 6200

## 2020-06-08 LAB — POCT I-STAT 7, (LYTES, BLD GAS, ICA,H+H)
Acid-base deficit: 6 mmol/L — ABNORMAL HIGH (ref 0.0–2.0)
Bicarbonate: 19.9 mmol/L — ABNORMAL LOW (ref 20.0–28.0)
Calcium, Ion: 1.17 mmol/L (ref 1.15–1.40)
HCT: 29 % — ABNORMAL LOW (ref 39.0–52.0)
Hemoglobin: 9.9 g/dL — ABNORMAL LOW (ref 13.0–17.0)
O2 Saturation: 100 %
Potassium: 3.8 mmol/L (ref 3.5–5.1)
Sodium: 140 mmol/L (ref 135–145)
TCO2: 21 mmol/L — ABNORMAL LOW (ref 22–32)
pCO2 arterial: 38.9 mmHg (ref 32.0–48.0)
pH, Arterial: 7.316 — ABNORMAL LOW (ref 7.350–7.450)
pO2, Arterial: 180 mmHg — ABNORMAL HIGH (ref 83.0–108.0)

## 2020-06-08 LAB — BASIC METABOLIC PANEL
Anion gap: 12 (ref 5–15)
BUN: 11 mg/dL (ref 8–23)
CO2: 19 mmol/L — ABNORMAL LOW (ref 22–32)
Calcium: 8.1 mg/dL — ABNORMAL LOW (ref 8.9–10.3)
Chloride: 107 mmol/L (ref 98–111)
Creatinine, Ser: 1 mg/dL (ref 0.61–1.24)
GFR calc Af Amer: >60 mL/min (ref >60)
GFR calc non Af Amer: >60 mL/min (ref >60)
Glucose, Bld: 159 mg/dL — ABNORMAL HIGH (ref 70–99)
Potassium: 3.8 mmol/L (ref 3.5–5.1)
Sodium: 138 mmol/L (ref 135–145)

## 2020-06-08 LAB — PREPARE PLATELET PHERESIS
Unit division: 0
Unit division: 0

## 2020-06-08 LAB — TRIGLYCERIDES: Triglycerides: 92 mg/dL (ref <150)

## 2020-06-08 MED ORDER — FENTANYL CITRATE (PF) 100 MCG/2ML IJ SOLN
25.0000 ug | INTRAMUSCULAR | Status: DC | PRN
  Administered 2020-06-08 – 2020-06-09 (×5): 25 ug via INTRAVENOUS
  Filled 2020-06-08 (×5): qty 2

## 2020-06-08 MED ORDER — INFLUENZA VAC A&B SA ADJ QUAD 0.5 ML IM PRSY
0.5000 mL | PREFILLED_SYRINGE | INTRAMUSCULAR | Status: AC
  Administered 2020-06-09: 0.5 mL via INTRAMUSCULAR
  Filled 2020-06-08: qty 0.5

## 2020-06-08 MED ORDER — MUPIROCIN 2 % EX OINT
1.0000 "application " | TOPICAL_OINTMENT | Freq: Two times a day (BID) | CUTANEOUS | Status: AC
  Administered 2020-06-08 – 2020-06-11 (×9): 1 via NASAL
  Filled 2020-06-08 (×3): qty 22

## 2020-06-08 MED ORDER — POTASSIUM CHLORIDE IN NACL 20-0.9 MEQ/L-% IV SOLN
INTRAVENOUS | Status: DC
  Filled 2020-06-08 (×14): qty 1000

## 2020-06-08 MED ORDER — CHLORHEXIDINE GLUCONATE CLOTH 2 % EX PADS
6.0000 | MEDICATED_PAD | Freq: Every day | CUTANEOUS | Status: AC
  Administered 2020-06-08 – 2020-06-11 (×4): 6 via TOPICAL

## 2020-06-08 MED ORDER — SODIUM CHLORIDE 0.9 % IV SOLN: INTRAVENOUS | Status: DC

## 2020-06-08 MED ORDER — LORAZEPAM 2 MG/ML IJ SOLN
1.0000 mg | INTRAMUSCULAR | Status: DC | PRN
  Administered 2020-06-08 – 2020-06-18 (×14): 2 mg via INTRAVENOUS
  Filled 2020-06-08 (×14): qty 1

## 2020-06-08 MED ORDER — PNEUMOCOCCAL VAC POLYVALENT 25 MCG/0.5ML IJ INJ
0.5000 mL | INJECTION | INTRAMUSCULAR | Status: AC
  Administered 2020-06-09: 0.5 mL via INTRAMUSCULAR
  Filled 2020-06-08: qty 0.5

## 2020-06-08 MED FILL — Thrombin (Recombinant) For Soln 5000 Unit: CUTANEOUS | Qty: 5000 | Status: AC

## 2020-06-08 NOTE — Plan of Care (Signed)
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
## 2020-06-09 ENCOUNTER — Inpatient Hospital Stay (HOSPITAL_COMMUNITY): Payer: Medicare Other

## 2020-06-09 LAB — CBC
HCT: 31.5 % — ABNORMAL LOW (ref 39.0–52.0)
Hemoglobin: 10.6 g/dL — ABNORMAL LOW (ref 13.0–17.0)
MCH: 34.9 pg — ABNORMAL HIGH (ref 26.0–34.0)
MCHC: 33.7 g/dL (ref 30.0–36.0)
MCV: 103.6 fL — ABNORMAL HIGH (ref 80.0–100.0)
Platelets: 238 10*3/uL (ref 150–400)
RBC: 3.04 MIL/uL — ABNORMAL LOW (ref 4.22–5.81)
RDW: 13.2 % (ref 11.5–15.5)
WBC: 12.4 10*3/uL — ABNORMAL HIGH (ref 4.0–10.5)
nRBC: 0.2 % (ref 0.0–0.2)

## 2020-06-09 LAB — BASIC METABOLIC PANEL
Anion gap: 12 (ref 5–15)
BUN: 10 mg/dL (ref 8–23)
CO2: 22 mmol/L (ref 22–32)
Calcium: 8.9 mg/dL (ref 8.9–10.3)
Chloride: 107 mmol/L (ref 98–111)
Creatinine, Ser: 0.84 mg/dL (ref 0.61–1.24)
GFR calc Af Amer: >60 mL/min (ref >60)
GFR calc non Af Amer: >60 mL/min (ref >60)
Glucose, Bld: 113 mg/dL — ABNORMAL HIGH (ref 70–99)
Potassium: 3.7 mmol/L (ref 3.5–5.1)
Sodium: 141 mmol/L (ref 135–145)

## 2020-06-09 LAB — MAGNESIUM
Magnesium: 1.6 mg/dL — ABNORMAL LOW (ref 1.7–2.4)
Magnesium: 1.7 mg/dL (ref 1.7–2.4)

## 2020-06-09 LAB — PHOSPHORUS
Phosphorus: 1.6 mg/dL — ABNORMAL LOW (ref 2.5–4.6)
Phosphorus: 1.9 mg/dL — ABNORMAL LOW (ref 2.5–4.6)

## 2020-06-09 LAB — GLUCOSE, CAPILLARY
Glucose-Capillary: 112 mg/dL — ABNORMAL HIGH (ref 70–99)
Glucose-Capillary: 107 mg/dL — ABNORMAL HIGH (ref 70–99)
Glucose-Capillary: 125 mg/dL — ABNORMAL HIGH (ref 70–99)
Glucose-Capillary: 138 mg/dL — ABNORMAL HIGH (ref 70–99)

## 2020-06-09 MED ORDER — PIVOT 1.5 CAL PO LIQD
1000.0000 mL | ORAL | Status: DC
  Administered 2020-06-09 – 2020-06-12 (×5): 1000 mL
  Filled 2020-06-09 (×8): qty 1000

## 2020-06-09 MED ORDER — CEFAZOLIN SODIUM-DEXTROSE 2-4 GM/100ML-% IV SOLN: 2.0000 g | Freq: Three times a day (TID) | INTRAVENOUS | Status: DC

## 2020-06-09 MED ORDER — PROSOURCE TF PO LIQD: 45.0000 mL | Freq: Two times a day (BID) | ORAL | Status: DC

## 2020-06-09 MED ORDER — DOCUSATE SODIUM 50 MG/5ML PO LIQD
100.0000 mg | Freq: Two times a day (BID) | ORAL | Status: DC
  Administered 2020-06-09 – 2020-06-16 (×12): 100 mg
  Filled 2020-06-09 (×12): qty 10

## 2020-06-09 MED ORDER — POLYETHYLENE GLYCOL 3350 17 G PO PACK
17.0000 g | PACK | Freq: Every day | ORAL | Status: DC
  Administered 2020-06-09 – 2020-06-12 (×4): 17 g
  Filled 2020-06-09 (×4): qty 1

## 2020-06-09 MED ORDER — OXYCODONE HCL 5 MG/5ML PO SOLN
10.0000 mg | ORAL | Status: DC | PRN
  Administered 2020-06-10 – 2020-06-12 (×7): 10 mg via ORAL
  Filled 2020-06-09 (×7): qty 10

## 2020-06-09 MED ORDER — VITAL HIGH PROTEIN PO LIQD: 1000.0000 mL | ORAL | Status: DC
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
## 2020-06-09 NOTE — Plan of Care (Signed)
  Problem: Clinical Measurements: Goal: Usual level of consciousness will be regained or maintained. Outcome: Progressing   
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
## 2020-06-09 NOTE — TOC CAGE-AID Note (Signed)
Transition of Care [facility]) - CAGE-AID Screening   Patient Details  Name: Chad Gray MRN: [id number] Date of Birth: April 02, 1956  Transition of Care [facility]) CM/SW Contact:    Jimmy Picket, LCSWA Phone Number: 06/09/2020, 9:43 AM   Clinical Narrative:  Patient unable to participate in assessment due to only being oriented to person.   CAGE-AID Screening: Substance Abuse Screening unable to be completed due to: : Patient unable to participate               Isabella Stalling Clinical Social Worker [phone]
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
## 2020-06-09 NOTE — Progress Notes (Signed)
Rehab Admissions Coordinator Note:  Patient was screened by Clois Dupes for appropriateness for an Inpatient Acute Rehab Consult.  At this time, we are recommending Inpatient Rehab consult. Please place order.  Clois Dupes RN MSN 06/09/2020, 3:03 PM  I can be reached at [phone].
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
## 2020-06-09 NOTE — Procedures (Signed)
Cortrak  Person Inserting Tube:  Osa Craver, RD Tube Type:  Cortrak - 43 inches Tube Location:  Right nare Initial Placement:  Stomach Secured by: Bridle Technique Used to Measure Tube Placement:  Documented cm marking at nare/ corner of mouth Cortrak Secured At:  72 cm Procedure Comments:  Cortrak Tube Team Note:  Consult received to place a Cortrak feeding tube.   No x-ray is required. RN may begin using tube.   If the tube becomes dislodged please keep the tube and contact the Cortrak team at [email or web address] (password [email or web address]) for replacement.  If after hours and replacement cannot be delayed, place a NG tube and confirm placement with an abdominal x-ray.   Romelle Starcher MS, RDN, LDN, CNSC Registered Dietitian III Clinical Nutrition RD Pager and On-Call Pager Number Located in [place]
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
## 2020-06-10 LAB — MAGNESIUM
Magnesium: 1.9 mg/dL (ref 1.7–2.4)
Magnesium: 2 mg/dL (ref 1.7–2.4)

## 2020-06-10 LAB — GLUCOSE, CAPILLARY
Glucose-Capillary: 105 mg/dL — ABNORMAL HIGH (ref 70–99)
Glucose-Capillary: 112 mg/dL — ABNORMAL HIGH (ref 70–99)
Glucose-Capillary: 115 mg/dL — ABNORMAL HIGH (ref 70–99)
Glucose-Capillary: 120 mg/dL — ABNORMAL HIGH (ref 70–99)
Glucose-Capillary: 123 mg/dL — ABNORMAL HIGH (ref 70–99)
Glucose-Capillary: 139 mg/dL — ABNORMAL HIGH (ref 70–99)

## 2020-06-10 LAB — BASIC METABOLIC PANEL
Anion gap: 8 (ref 5–15)
BUN: 13 mg/dL (ref 8–23)
CO2: 21 mmol/L — ABNORMAL LOW (ref 22–32)
Calcium: 8.7 mg/dL — ABNORMAL LOW (ref 8.9–10.3)
Chloride: 111 mmol/L (ref 98–111)
Creatinine, Ser: 0.79 mg/dL (ref 0.61–1.24)
GFR calc Af Amer: >60 mL/min (ref >60)
GFR calc non Af Amer: >60 mL/min (ref >60)
Glucose, Bld: 126 mg/dL — ABNORMAL HIGH (ref 70–99)
Potassium: 3.6 mmol/L (ref 3.5–5.1)
Sodium: 140 mmol/L (ref 135–145)

## 2020-06-10 LAB — CBC
HCT: 31.1 % — ABNORMAL LOW (ref 39.0–52.0)
Hemoglobin: 10.3 g/dL — ABNORMAL LOW (ref 13.0–17.0)
MCH: 34 pg (ref 26.0–34.0)
MCHC: 33.1 g/dL (ref 30.0–36.0)
MCV: 102.6 fL — ABNORMAL HIGH (ref 80.0–100.0)
Platelets: 206 10*3/uL (ref 150–400)
RBC: 3.03 MIL/uL — ABNORMAL LOW (ref 4.22–5.81)
RDW: 13.2 % (ref 11.5–15.5)
WBC: 11 10*3/uL — ABNORMAL HIGH (ref 4.0–10.5)
nRBC: 0 % (ref 0.0–0.2)

## 2020-06-10 LAB — PHOSPHORUS
Phosphorus: 1.5 mg/dL — ABNORMAL LOW (ref 2.5–4.6)
Phosphorus: 1.5 mg/dL — ABNORMAL LOW (ref 2.5–4.6)

## 2020-06-10 MED ORDER — ENOXAPARIN SODIUM 40 MG/0.4ML ~~LOC~~ SOLN
40.0000 mg | Freq: Every day | SUBCUTANEOUS | Status: DC
  Administered 2020-06-10 – 2020-06-21 (×12): 40 mg via SUBCUTANEOUS
  Filled 2020-06-10 (×13): qty 0.4

## 2020-06-10 MED ORDER — LEVETIRACETAM 100 MG/ML PO SOLN
500.0000 mg | Freq: Two times a day (BID) | ORAL | Status: DC
  Administered 2020-06-10 – 2020-06-12 (×6): 500 mg
  Filled 2020-06-10 (×6): qty 5
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
## 2020-06-10 NOTE — Plan of Care (Signed)
  Problem: Education: Goal: Knowledge of General Education information will improve Description: Including pain rating scale, medication(s)/side effects and non-pharmacologic comfort measures Outcome: Progressing   Problem: Health Behavior/Discharge Planning: Goal: Ability to manage health-related needs will improve Outcome: Progressing   Problem: Clinical Measurements: Goal: Ability to maintain clinical measurements within normal limits will improve Outcome: Progressing Goal: Will remain free from infection Outcome: Progressing Goal: Diagnostic test results will improve Outcome: Progressing Goal: Respiratory complications will improve Outcome: Progressing Goal: Cardiovascular complication will be avoided Outcome: Progressing   Problem: Activity: Goal: Risk for activity intolerance will decrease Outcome: Progressing   Problem: Nutrition: Goal: Adequate nutrition will be maintained Outcome: Progressing   Problem: Coping: Goal: Level of anxiety will decrease Outcome: Progressing   Problem: Elimination: Goal: Will not experience complications related to bowel motility Outcome: Progressing Goal: Will not experience complications related to urinary retention Outcome: Progressing   Problem: Pain Managment: Goal: General experience of comfort will improve Outcome: Progressing   Problem: Safety: Goal: Ability to remain free from injury will improve Outcome: Progressing   Problem: Skin Integrity: Goal: Risk for impaired skin integrity will decrease Outcome: Progressing   Problem: Education: Goal: Knowledge of the prescribed therapeutic regimen will improve Outcome: Progressing   Problem: Clinical Measurements: Goal: Usual level of consciousness will be regained or maintained. Outcome: Progressing Goal: Neurologic status will improve Outcome: Progressing Goal: Ability to maintain intracranial pressure will improve Outcome: Progressing   Problem: Skin  Integrity: Goal: Demonstration of wound healing without infection will improve Outcome: Progressing   

## 2020-06-10 NOTE — Progress Notes (Signed)
Subjective: NAEs o/n  Objective: Vital signs in last 24 hours: Temp:  [98.3 F (36.8 C)-100.8 F (38.2 C)] 98.3 F (36.8 C) (09/25 0809) Pulse Rate:  [86-115] 86 (09/25 0809) Resp:  [16-32] 20 (09/25 0809) BP: (108-164)/(64-112) 120/64 (09/25 0809) SpO2:  [95 %-98 %] 97 % (09/25 0304)  Intake/Output from previous day: 09/24 0701 - 09/25 0700 In: 2136.7 [I.V.:1224.6; NG/GT:912.2] Out: 2175 [Urine:2175] Intake/Output this shift: No intake/output data recorded.  Eyes open, awake, mumbling but unintelligible speech.  RUE weakness. Incision c/d/i  Lab Results: Recent Labs    06/09/20 0614 06/10/20 0623  WBC 12.4* 11.0*  HGB 10.6* 10.3*  HCT 31.5* 31.1*  PLT 238 206   BMET Recent Labs    06/09/20 0614 06/10/20 0623  NA 141 140  K 3.7 3.6  CL 107 111  CO2 22 21*  GLUCOSE 113* 126*  BUN 10 13  CREATININE 0.84 0.79  CALCIUM 8.9 8.7*    Studies/Results: DG Abd Portable 1V  Result Date: 06/09/2020 CLINICAL DATA:  Feeding tube placement EXAM: PORTABLE ABDOMEN - 1 VIEW COMPARISON:  CT abdomen and pelvis June 07, 2020 FINDINGS: Feeding tube tip is at the level of the pylorus-first portion duodenal junction. No bowel dilatation or air-fluid level to suggest bowel obstruction. No free air evident on supine examination. Visualized lung bases clear. IMPRESSION: Feeding tube tip at junction of pylorus and first portion duodenum. No bowel obstruction or evident free air. Electronically Signed   By: Bretta Bang III M.D.   On: 06/09/2020 11:01    Assessment/Plan: S/p left craniotomy for EDH - cont with PT/OT/SLT   Bedelia Person 06/10/2020, 11:34 AM
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
## 2020-06-10 NOTE — Progress Notes (Signed)
°   06/10/20 2259  Provider Notification  Provider Name/Title Byerly,MD  Date Provider Notified 06/10/20  Time Provider Notified 2259  Notification Type Page  Notification Reason Other (Comment) (Non-Violent restraints renewal)  Response Other (Comment) (waiting on order)  Date of Provider Response 06/10/20  Time of Provider Response 2300    Received order to Renew Non-violent restraints.

## 2020-06-11 ENCOUNTER — Encounter (HOSPITAL_COMMUNITY): Payer: Self-pay

## 2020-06-11 LAB — BASIC METABOLIC PANEL
Anion gap: 10 (ref 5–15)
BUN: 15 mg/dL (ref 8–23)
CO2: 21 mmol/L — ABNORMAL LOW (ref 22–32)
Calcium: 8.5 mg/dL — ABNORMAL LOW (ref 8.9–10.3)
Chloride: 109 mmol/L (ref 98–111)
Creatinine, Ser: 0.74 mg/dL (ref 0.61–1.24)
GFR calc Af Amer: >60 mL/min (ref >60)
GFR calc non Af Amer: >60 mL/min (ref >60)
Glucose, Bld: 124 mg/dL — ABNORMAL HIGH (ref 70–99)
Potassium: 3.4 mmol/L — ABNORMAL LOW (ref 3.5–5.1)
Sodium: 140 mmol/L (ref 135–145)

## 2020-06-11 LAB — GLUCOSE, CAPILLARY
Glucose-Capillary: 116 mg/dL — ABNORMAL HIGH (ref 70–99)
Glucose-Capillary: 133 mg/dL — ABNORMAL HIGH (ref 70–99)
Glucose-Capillary: 116 mg/dL — ABNORMAL HIGH (ref 70–99)
Glucose-Capillary: 113 mg/dL — ABNORMAL HIGH (ref 70–99)
Glucose-Capillary: 117 mg/dL — ABNORMAL HIGH (ref 70–99)
Glucose-Capillary: 111 mg/dL — ABNORMAL HIGH (ref 70–99)

## 2020-06-11 LAB — CBC
HCT: 28.9 % — ABNORMAL LOW (ref 39.0–52.0)
Hemoglobin: 9.6 g/dL — ABNORMAL LOW (ref 13.0–17.0)
MCH: 33.9 pg (ref 26.0–34.0)
MCHC: 33.2 g/dL (ref 30.0–36.0)
MCV: 102.1 fL — ABNORMAL HIGH (ref 80.0–100.0)
Platelets: 200 10*3/uL (ref 150–400)
RBC: 2.83 MIL/uL — ABNORMAL LOW (ref 4.22–5.81)
RDW: 12.8 % (ref 11.5–15.5)
WBC: 8.1 10*3/uL (ref 4.0–10.5)
nRBC: 0 % (ref 0.0–0.2)
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
## 2020-06-11 NOTE — Progress Notes (Signed)
Subjective: NAEs  Objective: Vital signs in last 24 hours: Temp:  [98.2 F (36.8 C)-99.6 F (37.6 C)] 98.8 F (37.1 C) (09/26 0723) Pulse Rate:  [67-87] 70 (09/26 0800) Resp:  [13-23] 17 (09/26 0800) BP: (105-135)/(67-85) 105/72 (09/26 0800) SpO2:  [94 %-100 %] 94 % (09/26 0300) Weight:  [77 kg] 77 kg (09/26 0457)  Intake/Output from previous day: 09/25 0701 - 09/26 0700 In: 1749.6 [I.V.:1239.6; NG/GT:510] Out: 850 [Urine:850] Intake/Output this shift: No intake/output data recorded.  Eyes open, PERRL.  Speech unintelligible.  Follows simple commands, decreased movement on right side.  Incision c/d/i.  Lab Results: Recent Labs    06/10/20 0623 06/11/20 0454  WBC 11.0* 8.1  HGB 10.3* 9.6*  HCT 31.1* 28.9*  PLT 206 200   BMET Recent Labs    06/10/20 0623 06/11/20 0454  NA 140 140  K 3.6 3.4*  CL 111 109  CO2 21* 21*  GLUCOSE 126* 124*  BUN 13 15  CREATININE 0.79 0.74  CALCIUM 8.7* 8.5*    Studies/Results: DG Abd Portable 1V  Result Date: 06/09/2020 CLINICAL DATA:  Feeding tube placement EXAM: PORTABLE ABDOMEN - 1 VIEW COMPARISON:  CT abdomen and pelvis June 07, 2020 FINDINGS: Feeding tube tip is at the level of the pylorus-first portion duodenal junction. No bowel dilatation or air-fluid level to suggest bowel obstruction. No free air evident on supine examination. Visualized lung bases clear. IMPRESSION: Feeding tube tip at junction of pylorus and first portion duodenum. No bowel obstruction or evident free air. Electronically Signed   By: Bretta Bang III M.D.   On: 06/09/2020 11:01    Assessment/Plan: S/p left parietal craniotomy for EDH - cont PT/OT/SLT - hold Plavix for now - Lovenox for DVT ppx   Chad Gray 06/11/2020, 10:44 AM

## 2020-06-12 ENCOUNTER — Inpatient Hospital Stay (HOSPITAL_COMMUNITY): Payer: Medicare Other

## 2020-06-12 DIAGNOSIS — Z9889 Other specified postprocedural states: Secondary | ICD-10-CM | POA: Diagnosis not present

## 2020-06-12 LAB — BASIC METABOLIC PANEL
Anion gap: 10 (ref 5–15)
BUN: 19 mg/dL (ref 8–23)
CO2: 20 mmol/L — ABNORMAL LOW (ref 22–32)
Calcium: 8.5 mg/dL — ABNORMAL LOW (ref 8.9–10.3)
Chloride: 111 mmol/L (ref 98–111)
Creatinine, Ser: 0.66 mg/dL (ref 0.61–1.24)
GFR calc Af Amer: >60 mL/min (ref >60)
GFR calc non Af Amer: >60 mL/min (ref >60)
Glucose, Bld: 111 mg/dL — ABNORMAL HIGH (ref 70–99)
Potassium: 3.9 mmol/L (ref 3.5–5.1)
Sodium: 141 mmol/L (ref 135–145)

## 2020-06-12 LAB — GLUCOSE, CAPILLARY
Glucose-Capillary: 109 mg/dL — ABNORMAL HIGH (ref 70–99)
Glucose-Capillary: 148 mg/dL — ABNORMAL HIGH (ref 70–99)
Glucose-Capillary: 121 mg/dL — ABNORMAL HIGH (ref 70–99)
Glucose-Capillary: 97 mg/dL (ref 70–99)
Glucose-Capillary: 99 mg/dL (ref 70–99)

## 2020-06-12 LAB — CBC
HCT: 30.1 % — ABNORMAL LOW (ref 39.0–52.0)
Hemoglobin: 10 g/dL — ABNORMAL LOW (ref 13.0–17.0)
MCH: 34.6 pg — ABNORMAL HIGH (ref 26.0–34.0)
MCHC: 33.2 g/dL (ref 30.0–36.0)
MCV: 104.2 fL — ABNORMAL HIGH (ref 80.0–100.0)
Platelets: 259 10*3/uL (ref 150–400)
RBC: 2.89 MIL/uL — ABNORMAL LOW (ref 4.22–5.81)
RDW: 13 % (ref 11.5–15.5)
WBC: 8.4 10*3/uL (ref 4.0–10.5)
nRBC: 0.2 % (ref 0.0–0.2)

## 2020-06-12 LAB — TROPONIN I (HIGH SENSITIVITY)
Troponin I (High Sensitivity): 6 ng/L (ref <18)
Troponin I (High Sensitivity): 6 ng/L (ref <18)

## 2020-06-12 MED ORDER — ORAL CARE MOUTH RINSE
15.0000 mL | Freq: Two times a day (BID) | OROMUCOSAL | Status: DC
  Administered 2020-06-15 – 2020-06-17 (×3): 15 mL via OROMUCOSAL

## 2020-06-12 MED ORDER — MORPHINE SULFATE (PF) 2 MG/ML IV SOLN
1.0000 mg | INTRAVENOUS | Status: DC | PRN
  Administered 2020-06-13 – 2020-06-14 (×5): 1 mg via INTRAVENOUS
  Filled 2020-06-12 (×5): qty 1

## 2020-06-12 MED ORDER — PANCRELIPASE (LIP-PROT-AMYL) 10440-39150 UNITS PO TABS
20880.0000 [IU] | ORAL_TABLET | Freq: Once | ORAL | Status: AC
  Administered 2020-06-12: 20880 [IU]
  Filled 2020-06-12: qty 2

## 2020-06-12 MED ORDER — METOPROLOL TARTRATE 25 MG/10 ML ORAL SUSPENSION
50.0000 mg | Freq: Every day | ORAL | Status: DC
  Administered 2020-06-12 – 2020-06-16 (×4): 50 mg
  Filled 2020-06-12 (×4): qty 20

## 2020-06-12 MED ORDER — OXYCODONE HCL 5 MG/5ML PO SOLN
10.0000 mg | ORAL | Status: DC | PRN
  Administered 2020-06-12: 10 mg via ORAL
  Filled 2020-06-12: qty 10

## 2020-06-12 MED ORDER — BISACODYL 10 MG RE SUPP
10.0000 mg | Freq: Once | RECTAL | Status: AC
  Administered 2020-06-12: 10 mg via RECTAL
  Filled 2020-06-12: qty 1

## 2020-06-12 MED ORDER — CHLORHEXIDINE GLUCONATE 0.12 % MT SOLN
15.0000 mL | Freq: Two times a day (BID) | OROMUCOSAL | Status: DC
  Administered 2020-06-13 – 2020-06-21 (×11): 15 mL via OROMUCOSAL
  Filled 2020-06-12 (×14): qty 15

## 2020-06-12 MED ORDER — SODIUM BICARBONATE 650 MG PO TABS
650.0000 mg | ORAL_TABLET | Freq: Once | ORAL | Status: AC
  Administered 2020-06-12: 650 mg
  Filled 2020-06-12: qty 1
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
## 2020-06-12 NOTE — Progress Notes (Signed)
Pt continues to be disorientedx4 and mostly unintelligible. Daughter now at bedside and pt complaining that he "can't breath" and that his chest hurts. Pt with history of MIx2, COPD, and CABG with pacemaker. Vital signs all WNL. Dr. Cliffton Asters text-paged. See new order for STAT EKG, chest xray, and troponins.   Robina Ade, RN

## 2020-06-12 NOTE — Progress Notes (Signed)
Upon assessment this am, pt disorientedx4 and follows commands about 50% of the time. Exhibiting expressive aphasia. Per nightshift RN, these are not new findings.   Robina Ade, RN

## 2020-06-12 NOTE — Progress Notes (Signed)
Subjective: Patient reports Patient is wide-awake  Objective: Vital signs in last 24 hours: Temp:  [98 F (36.7 C)-98.9 F (37.2 C)] 98 F (36.7 C) (09/27 1148) Pulse Rate:  [61-74] 65 (09/27 1148) Resp:  [12-18] 13 (09/27 1148) BP: (101-150)/(71-88) 127/72 (09/27 1148) SpO2:  [96 %-99 %] 99 % (09/27 1148) Weight:  [78.2 kg] 78.2 kg (09/27 0500)  Intake/Output from previous day: 09/26 0701 - 09/27 0700 In: 2484.7 [I.V.:1133.1; NG/GT:1351.6] Out: 900 [Urine:900] Intake/Output this shift: No intake/output data recorded.  Awake alert confused aphasic moves all extremities vision clean dry and intact  Lab Results: Recent Labs    06/11/20 0454 06/12/20 0614  WBC 8.1 8.4  HGB 9.6* 10.0*  HCT 28.9* 30.1*  PLT 200 259   BMET Recent Labs    06/11/20 0454 06/12/20 0614  NA 140 141  K 3.4* 3.9  CL 109 111  CO2 21* 20*  GLUCOSE 124* 111*  BUN 15 19  CREATININE 0.74 0.66  CALCIUM 8.5* 8.5*    Studies/Results: No results found.  Assessment/Plan: Postop day 5 epidural craniotomy aphasic and confused continue speech and occupational and physical therapy therapy patient will need rehab placement  LOS: 5 days     Mariam Dollar 06/12/2020, 1:48 PM
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
## 2020-06-12 NOTE — Progress Notes (Signed)
Pt found with tube from tube feed in bed leaking everywhere. Attempted to flush tube and would only flush very slowly. Lopez valve changed with not much difference in flushing. Feed currently on hold, due to probable clog in tube that is not working. Nightshift RN, Felizardo Hoffmann, agreeable to work with tube and contact MD if necessary.   Robina Ade, RN

## 2020-06-13 LAB — BASIC METABOLIC PANEL WITH GFR
Anion gap: 9 (ref 5–15)
BUN: 15 mg/dL (ref 8–23)
CO2: 19 mmol/L — ABNORMAL LOW (ref 22–32)
Calcium: 9.1 mg/dL (ref 8.9–10.3)
Chloride: 110 mmol/L (ref 98–111)
Creatinine, Ser: 0.7 mg/dL (ref 0.61–1.24)
GFR calc Af Amer: >60 mL/min
GFR calc non Af Amer: >60 mL/min
Glucose, Bld: 105 mg/dL — ABNORMAL HIGH (ref 70–99)
Potassium: 4.3 mmol/L (ref 3.5–5.1)
Sodium: 138 mmol/L (ref 135–145)

## 2020-06-13 LAB — PHOSPHORUS: Phosphorus: 3.4 mg/dL (ref 2.5–4.6)

## 2020-06-13 LAB — GLUCOSE, CAPILLARY
Glucose-Capillary: 110 mg/dL — ABNORMAL HIGH (ref 70–99)
Glucose-Capillary: 121 mg/dL — ABNORMAL HIGH (ref 70–99)
Glucose-Capillary: 89 mg/dL (ref 70–99)
Glucose-Capillary: 91 mg/dL (ref 70–99)
Glucose-Capillary: 93 mg/dL (ref 70–99)
Glucose-Capillary: 93 mg/dL (ref 70–99)
Glucose-Capillary: 99 mg/dL (ref 70–99)

## 2020-06-13 LAB — MAGNESIUM: Magnesium: 2.1 mg/dL (ref 1.7–2.4)

## 2020-06-13 MED ORDER — QUETIAPINE FUMARATE 50 MG PO TABS
50.0000 mg | ORAL_TABLET | Freq: Every day | ORAL | Status: DC
  Administered 2020-06-14 – 2020-06-17 (×4): 50 mg
  Filled 2020-06-13 (×4): qty 1

## 2020-06-13 MED ORDER — CLONAZEPAM 0.1 MG/ML ORAL SUSPENSION: 0.2500 mg | Freq: Two times a day (BID) | ORAL | Status: DC

## 2020-06-13 MED ORDER — OXYCODONE HCL 5 MG/5ML PO SOLN
5.0000 mg | ORAL | Status: DC | PRN
  Administered 2020-06-15 – 2020-06-17 (×2): 10 mg via ORAL
  Administered 2020-06-17: 5 mg via ORAL
  Administered 2020-06-18 – 2020-06-19 (×3): 10 mg via ORAL
  Filled 2020-06-13 (×4): qty 10
  Filled 2020-06-13: qty 5
  Filled 2020-06-13: qty 10

## 2020-06-13 MED ORDER — CLONAZEPAM 0.25 MG PO TBDP
0.2500 mg | ORAL_TABLET | Freq: Two times a day (BID) | ORAL | Status: DC
  Administered 2020-06-14: 0.25 mg via ORAL
  Filled 2020-06-13 (×2): qty 1

## 2020-06-13 MED ORDER — GLYCERIN (LAXATIVE) 2.1 G RE SUPP
1.0000 | Freq: Once | RECTAL | Status: DC
  Filled 2020-06-13: qty 1

## 2020-06-13 MED ORDER — POLYETHYLENE GLYCOL 3350 17 G PO PACK
17.0000 g | PACK | Freq: Two times a day (BID) | ORAL | Status: DC
  Administered 2020-06-14 – 2020-06-22 (×14): 17 g
  Filled 2020-06-13 (×14): qty 1

## 2020-06-13 MED ORDER — LEVETIRACETAM IN NACL 500 MG/100ML IV SOLN
500.0000 mg | Freq: Two times a day (BID) | INTRAVENOUS | Status: DC
  Administered 2020-06-13 – 2020-06-14 (×3): 500 mg via INTRAVENOUS
  Filled 2020-06-13 (×3): qty 100
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
## 2020-06-13 NOTE — Progress Notes (Signed)
Pt to have glycerin suppository for constipation. RN attempted to give but was not able to. Pt was very agitated and combative and has been all day. Pt had BM from suppository on 9/27.

## 2020-06-13 NOTE — Anesthesia Postprocedure Evaluation (Signed)
Anesthesia Post Note  Patient: Chad Gray  Procedure(s) Performed: CRANIOTOMY HEMATOMA EVACUATION EPIDURAL (Left )     Patient location during evaluation: PACU Anesthesia Type: General Level of consciousness: sedated Pain management: pain level controlled Vital Signs Assessment: post-procedure vital signs reviewed and stable Respiratory status: spontaneous breathing, nonlabored ventilation, respiratory function stable and patient connected to nasal cannula oxygen Cardiovascular status: blood pressure returned to baseline and stable Postop Assessment: no apparent nausea or vomiting Anesthetic complications: no   No complications documented.  Last Vitals:  Vitals:   06/13/20 0347 06/13/20 0746  BP: (!) 155/93 140/75  Pulse: 74 80  Resp: 15 19  Temp: 36.8 C 37.5 C  SpO2: 96% 96%    Last Pain:  Vitals:   06/13/20 0930  TempSrc:   PainSc: Asleep                 Lanier Felty S

## 2020-06-13 NOTE — Progress Notes (Signed)
Subjective: Patient confused and resting comfortably in bed.   Objective: Vital signs in last 24 hours: Temp:  [97.5 F (36.4 C)-99.5 F (37.5 C)] 99.5 F (37.5 C) (09/28 0746) Pulse Rate:  [64-80] 80 (09/28 0746) Resp:  [13-19] 19 (09/28 0746) BP: (127-158)/(72-93) 140/75 (09/28 0746) SpO2:  [96 %-99 %] 96 % (09/28 0746) Weight:  [77.7 kg] 77.7 kg (09/28 0448)  Intake/Output from previous day: No intake/output data recorded. Intake/Output this shift: No intake/output data recorded.  Neurologic: confused, aphasic, unable to [facility]  Lab Results: Lab Results  Component Value Date   WBC 8.4 06/12/2020   HGB 10.0 (L) 06/12/2020   HCT 30.1 (L) 06/12/2020   MCV 104.2 (H) 06/12/2020   PLT 259 06/12/2020   Lab Results  Component Value Date   INR 1.1 06/07/2020   BMET Lab Results  Component Value Date   NA 141 06/12/2020   K 3.9 06/12/2020   CL 111 06/12/2020   CO2 20 (L) 06/12/2020   GLUCOSE 111 (H) 06/12/2020   BUN 19 06/12/2020   CREATININE 0.66 06/12/2020   CALCIUM 8.5 (L) 06/12/2020    Studies/Results: DG Chest Port 1 View  Result Date: 06/12/2020 CLINICAL DATA:  Chest pain. EXAM: PORTABLE CHEST 1 VIEW COMPARISON:  June 07, 2020. FINDINGS: Stable cardiomediastinal silhouette. Feeding tube is seen entering stomach. Left-sided pacemaker is unchanged. No pneumothorax or pleural effusion is noted. Left lung is clear. Bony thorax is unremarkable. IMPRESSION: No active disease. Electronically Signed   By: Lupita Raider M.D.   On: 06/12/2020 18:13    Assessment/Plan: Postop day 6 crani for evacuation of epidural hematoma. Stable and continues to be aphasic and confused. Therapies today. Good candidate for CIR.    LOS: 6 days    Tiana Loft [facility] 06/13/2020, 8:07 AM
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
## 2020-06-14 LAB — GLUCOSE, CAPILLARY
Glucose-Capillary: 93 mg/dL (ref 70–99)
Glucose-Capillary: 86 mg/dL (ref 70–99)
Glucose-Capillary: 103 mg/dL — ABNORMAL HIGH (ref 70–99)
Glucose-Capillary: 107 mg/dL — ABNORMAL HIGH (ref 70–99)
Glucose-Capillary: 118 mg/dL — ABNORMAL HIGH (ref 70–99)
Glucose-Capillary: 92 mg/dL (ref 70–99)

## 2020-06-14 MED ORDER — PROSOURCE TF PO LIQD
90.0000 mL | Freq: Two times a day (BID) | ORAL | Status: DC
  Administered 2020-06-14 – 2020-06-20 (×12): 90 mL
  Filled 2020-06-14 (×12): qty 90

## 2020-06-14 MED ORDER — CLONAZEPAM 0.25 MG PO TBDP
0.2500 mg | ORAL_TABLET | Freq: Two times a day (BID) | ORAL | Status: DC
  Administered 2020-06-14 – 2020-06-16 (×5): 0.25 mg
  Filled 2020-06-14 (×5): qty 1

## 2020-06-14 MED ORDER — LEVETIRACETAM 100 MG/ML PO SOLN
500.0000 mg | Freq: Two times a day (BID) | ORAL | Status: AC
  Administered 2020-06-14 – 2020-06-16 (×5): 500 mg
  Filled 2020-06-14 (×5): qty 5

## 2020-06-14 MED ORDER — OSMOLITE 1.5 CAL PO LIQD
1000.0000 mL | ORAL | Status: DC
  Administered 2020-06-14 – 2020-06-18 (×6): 1000 mL
  Filled 2020-06-14 (×3): qty 1000
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
## 2020-06-14 NOTE — Procedures (Signed)
Cortrak  Person Inserting Tube:  King, Freddy Spadafora E, RD Tube Type:  Cortrak - 43 inches Tube Location:  Right nare Initial Placement:  Stomach Secured by: Bridle Technique Used to Measure Tube Placement:  Documented cm marking at nare/ corner of mouth Cortrak Secured At:  70 cm    Cortrak Tube Team Note:  Consult received to place a Cortrak feeding tube.   No x-ray is required. RN may begin using tube.   If the tube becomes dislodged please keep the tube and contact the Cortrak team at [email or web address] ([email or web address]) for replacement.  If after hours and replacement cannot be delayed, place a NG tube and confirm placement with an abdominal x-ray.   Seanna Sisler King, MS, RD, LDN Pager number available on Amion 

## 2020-06-14 NOTE — Progress Notes (Signed)
New coretrak successfully placed. Previous coretrak had become kinked in stomach. Multiple attempts required to insert. According to coretrak team member, pt's stomach is high up anatomically and very small, so it is possible that that is why his previous coretrak kinked and this one may as well.  Robina Ade, RN

## 2020-06-14 NOTE — Progress Notes (Signed)
Patient ID: Chad Gray, male   DOB: [birth year], 64 y.o.   MRN: [id number] Patient stable remains aphasic moves all extremities vocalizes but does not follow commands  Incisions clean dry and intact  No new neurosurgical recommendations at this time staples can be removed in 10 days would work on rehab placement

## 2020-06-14 NOTE — Progress Notes (Addendum)
OT Cancellation Note  Patient Details Name: Chad Gray MRN: [id number] DOB: 1955-10-23   Cancelled Treatment:    Reason Eval/Treat Not Completed: Fatigue/lethargy limiting ability to participate; pt sleeping soundly upon entering room, no arousal to voice with with minimal arousal to movement/noxious stimuli however quickly falling back asleep (no eye opening). Will follow up for OT treatment as able.   Of note reattempted later this PM with no signs of arousal despite max efforts. Will follow.   Marcy Siren, OT Acute Rehabilitation Services Pager [phone] Office [phone]   Chad Gray 06/14/2020, 2:43 PM

## 2020-06-15 LAB — GLUCOSE, CAPILLARY
Glucose-Capillary: 102 mg/dL — ABNORMAL HIGH (ref 70–99)
Glucose-Capillary: 103 mg/dL — ABNORMAL HIGH (ref 70–99)
Glucose-Capillary: 103 mg/dL — ABNORMAL HIGH (ref 70–99)
Glucose-Capillary: 106 mg/dL — ABNORMAL HIGH (ref 70–99)
Glucose-Capillary: 87 mg/dL (ref 70–99)
Glucose-Capillary: 96 mg/dL (ref 70–99)
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
## 2020-06-16 LAB — GLUCOSE, CAPILLARY
Glucose-Capillary: 94 mg/dL (ref 70–99)
Glucose-Capillary: 127 mg/dL — ABNORMAL HIGH (ref 70–99)
Glucose-Capillary: 121 mg/dL — ABNORMAL HIGH (ref 70–99)
Glucose-Capillary: 119 mg/dL — ABNORMAL HIGH (ref 70–99)
Glucose-Capillary: 102 mg/dL — ABNORMAL HIGH (ref 70–99)

## 2020-06-16 NOTE — Plan of Care (Signed)
  Problem: Clinical Measurements: Goal: Ability to maintain clinical measurements within normal limits will improve Outcome: Progressing Goal: Will remain free from infection Outcome: Progressing Goal: Diagnostic test results will improve Outcome: Progressing   Problem: Activity: Goal: Risk for activity intolerance will decrease Outcome: Progressing   Problem: Skin Integrity: Goal: Risk for impaired skin integrity will decrease Outcome: Progressing   Problem: Clinical Measurements: Goal: Usual level of consciousness will be regained or maintained. Outcome: Progressing Goal: Ability to maintain intracranial pressure will improve Outcome: Progressing
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
## 2020-06-17 LAB — GLUCOSE, CAPILLARY
Glucose-Capillary: 108 mg/dL — ABNORMAL HIGH (ref 70–99)
Glucose-Capillary: 99 mg/dL (ref 70–99)
Glucose-Capillary: 111 mg/dL — ABNORMAL HIGH (ref 70–99)
Glucose-Capillary: 141 mg/dL — ABNORMAL HIGH (ref 70–99)
Glucose-Capillary: 87 mg/dL (ref 70–99)

## 2020-06-17 MED ORDER — DOCUSATE SODIUM 100 MG PO CAPS
100.0000 mg | ORAL_CAPSULE | Freq: Two times a day (BID) | ORAL | Status: DC
  Administered 2020-06-21 (×2): 100 mg via ORAL
  Filled 2020-06-17 (×4): qty 1

## 2020-06-17 MED ORDER — CLONAZEPAM 0.25 MG PO TBDP
0.2500 mg | ORAL_TABLET | Freq: Two times a day (BID) | ORAL | Status: DC
  Administered 2020-06-17 – 2020-06-18 (×3): 0.25 mg via ORAL
  Filled 2020-06-17 (×3): qty 1

## 2020-06-17 MED ORDER — METOPROLOL TARTRATE 50 MG PO TABS
50.0000 mg | ORAL_TABLET | Freq: Every day | ORAL | Status: DC
  Administered 2020-06-17 – 2020-06-23 (×7): 50 mg via ORAL
  Filled 2020-06-17 (×7): qty 1
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
## 2020-06-18 LAB — GLUCOSE, CAPILLARY
Glucose-Capillary: 100 mg/dL — ABNORMAL HIGH (ref 70–99)
Glucose-Capillary: 102 mg/dL — ABNORMAL HIGH (ref 70–99)
Glucose-Capillary: 127 mg/dL — ABNORMAL HIGH (ref 70–99)
Glucose-Capillary: 84 mg/dL (ref 70–99)
Glucose-Capillary: 88 mg/dL (ref 70–99)
Glucose-Capillary: 95 mg/dL (ref 70–99)

## 2020-06-18 MED ORDER — ENSURE ENLIVE PO LIQD
237.0000 mL | Freq: Two times a day (BID) | ORAL | Status: DC
  Administered 2020-06-19: 237 mL via ORAL

## 2020-06-18 MED ORDER — CLONAZEPAM 0.5 MG PO TBDP
0.5000 mg | ORAL_TABLET | Freq: Two times a day (BID) | ORAL | Status: DC
  Administered 2020-06-18 – 2020-06-23 (×10): 0.5 mg via ORAL
  Filled 2020-06-18 (×10): qty 1

## 2020-06-18 MED ORDER — QUETIAPINE FUMARATE 100 MG PO TABS
100.0000 mg | ORAL_TABLET | Freq: Every day | ORAL | Status: DC
  Administered 2020-06-18 – 2020-06-22 (×5): 100 mg
  Filled 2020-06-18 (×5): qty 1

## 2020-06-18 MED ORDER — OSMOLITE 1.5 CAL PO LIQD
1000.0000 mL | ORAL | Status: DC
  Administered 2020-06-18 – 2020-06-19 (×2): 1000 mL
  Filled 2020-06-18 (×2): qty 1000

## 2020-06-18 MED ORDER — OSMOLITE 1.5 CAL PO LIQD: 1000.0000 mL | Freq: Every day | ORAL | Status: DC
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
## 2020-06-18 NOTE — Progress Notes (Signed)
Attempted to start nocturnal tube feeds Osmolite 1.5 cal not in bin message sent to pharmacy to send.   Patient was ambulated to the bathroom x2 assist. Patient tolerated well.

## 2020-06-18 NOTE — Progress Notes (Signed)
2015-Attempted to start nocturnal tube feeds. Tube feed was not available or tube up. Messaged pharmacy to send a new tube feed bottle of Osmolite 1.5.  2040-Pharmacy was called to send tube feeds after received message from pharmacy  that their was no order for tube feed.    2143-Tube feed Osmolite 1.5 was sent up and started at this time.   Denver Faster, RN

## 2020-06-18 NOTE — Progress Notes (Signed)
Medications given by gravity via Cortrak, and line flushed with 50 ml sterile water after. Pt tolerated well with no complaints of pain or cramping.
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
## 2020-06-19 LAB — GLUCOSE, CAPILLARY
Glucose-Capillary: 100 mg/dL — ABNORMAL HIGH (ref 70–99)
Glucose-Capillary: 104 mg/dL — ABNORMAL HIGH (ref 70–99)
Glucose-Capillary: 106 mg/dL — ABNORMAL HIGH (ref 70–99)
Glucose-Capillary: 110 mg/dL — ABNORMAL HIGH (ref 70–99)
Glucose-Capillary: 90 mg/dL (ref 70–99)

## 2020-06-19 NOTE — Progress Notes (Signed)
0605-tube feeds stopped per orders.
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
## 2020-06-20 LAB — BASIC METABOLIC PANEL
Anion gap: 10 (ref 5–15)
BUN: 30 mg/dL — ABNORMAL HIGH (ref 8–23)
CO2: 24 mmol/L (ref 22–32)
Calcium: 9.5 mg/dL (ref 8.9–10.3)
Chloride: 102 mmol/L (ref 98–111)
Creatinine, Ser: 0.93 mg/dL (ref 0.61–1.24)
GFR calc Af Amer: >60 mL/min (ref >60)
GFR calc non Af Amer: >60 mL/min (ref >60)
Glucose, Bld: 95 mg/dL (ref 70–99)
Potassium: 3.7 mmol/L (ref 3.5–5.1)
Sodium: 136 mmol/L (ref 135–145)

## 2020-06-20 LAB — GLUCOSE, CAPILLARY
Glucose-Capillary: 106 mg/dL — ABNORMAL HIGH (ref 70–99)
Glucose-Capillary: 142 mg/dL — ABNORMAL HIGH (ref 70–99)
Glucose-Capillary: 94 mg/dL (ref 70–99)

## 2020-06-20 MED ORDER — PROSOURCE PLUS PO LIQD
30.0000 mL | Freq: Two times a day (BID) | ORAL | Status: DC
  Administered 2020-06-21: 30 mL via ORAL
  Filled 2020-06-20: qty 30

## 2020-06-20 NOTE — Progress Notes (Signed)
Pt nocturnal tube feed has been turned off
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
## 2020-06-20 NOTE — Care Management Important Message (Signed)
Important Message  Patient Details  Name: Chad Gray MRN: [id number] Date of Birth: 1956-05-30   Medicare Important Message Given:  Yes     Dejah Droessler Stefan Church 06/20/2020, 4:37 PM

## 2020-06-20 NOTE — Progress Notes (Signed)
Patient has done very well today. Just talked with Foye Spurling and received verbal orders to D/C cortrek. Patient was able to eat a scrambbled egg sandwitch without any problems. Patient was changed to a regular diet to accommodate the patients request for egg sandwiches. Vernona Rieger Nix CCC-SLP was notified of the changes and voiced that she will assess to patient tomorrow morning for any diet needs. Patient has not required the use of restraints today, he has only needed redirection and the use of the bed/chair alarm.
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
## 2020-06-21 LAB — SARS CORONAVIRUS 2 BY RT PCR (HOSPITAL ORDER, PERFORMED IN ~~LOC~~ HOSPITAL LAB): SARS Coronavirus 2: NEGATIVE

## 2020-06-21 LAB — GLUCOSE, CAPILLARY
Glucose-Capillary: 83 mg/dL (ref 70–99)
Glucose-Capillary: 107 mg/dL — ABNORMAL HIGH (ref 70–99)

## 2020-06-21 MED ORDER — PROSOURCE PLUS PO LIQD
30.0000 mL | Freq: Three times a day (TID) | ORAL | Status: DC
  Filled 2020-06-21 (×2): qty 30

## 2020-06-21 MED ORDER — BOOST / RESOURCE BREEZE PO LIQD CUSTOM: 1.0000 | Freq: Three times a day (TID) | ORAL | Status: DC

## 2020-06-21 NOTE — Progress Notes (Signed)
PT Cancellation Note  Patient Details Name: Chad Gray MRN: [id number] DOB: 09/27/1955   Cancelled Treatment:    Reason Eval/Treat Not Completed: Other (comment). Pt attempted x3, pt was eating breakfast and asked to wait until after breakfast, then working with speech therapy, and now resting in bed s/p being agitated and restless all morning. Acute PT to return as able to progress mobility.  [name], PT, DPT Acute Rehabilitation Services Pager #: [phone] Office #: [phone]    Iona Hansen 06/21/2020, 12:19 PM
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
## 2020-06-21 NOTE — Progress Notes (Signed)
Pt refusing to have BP taken, and becomes agitated when NT insists. Telemetry monitor and SpO2 monitor continuous

## 2020-06-21 NOTE — Progress Notes (Signed)
Calorie Count Note  Diet: Regular diet with thin liquids Supplements:   Ensure Enlive po BID, each supplement provides 350 kcal and 20 grams of protein  30 ml Prosource plus po BID, each supplement provides 100 kcal and 15 grams of protein.   Breakfast: 113 kcal, 4 grams of protei Lunch: 226 kcal, 8 grams of protein Dinner: 226 kcal, 8 grams of protein Supplements: 100 kcal, 15 grams of protein  Total intake: 565 kcal (26% of kcal needs)  35 grams of protein (27% of protein needs)  Estimated Nutritional Needs:  Kcal:  2200-2500 Protein:  130-160 grams Fluid:  >2 L/day  Pt has been refusing Ensure supplements. RD to order Boost Breeze instead to aid in caloric and protein needs. Pt encouraged to eat his food at meals.   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   Encourage adequate PO intake.   Roslyn Smiling, MS, RD, LDN RD pager number/after hours weekend pager number on Amion.

## 2020-06-21 NOTE — TOC Progression Note (Addendum)
Transition of Care [facility]) - Progression Note    Patient Details  Name: Chad Gray MRN: [id number] Date of Birth: 05-15-1956  Transition of Care [facility]) CM/SW Contact  Chad Mac, RN Phone Number: 06/21/2020, 1:41 PM  Clinical Narrative:   Spoke with patient's daughter, Chad Gray, regarding skilled nursing facility bed choice: Daughter chooses [facility] in [place].  Left message for admissions department at facility to accept bed.  Will initiate insurance authorization upon confirmation of skilled nursing facility bed.  Addendum: (1341) Spoke with Chad Gray in admissions at [facility]; she states that patient has been declined for admission due to "behavior issues ".  Explained to Chad Gray that patient's family has already accepted this bed that had been offered for the last 2 days.  She states that she does not make this decision and deferred me to Chad Gray, [phone], who apparently made this decision.  Left message for Chad Gray; will follow up with her upon her call back.   1430 Received call back from Chad Gray with [facility]; she states she cannot accept patient as he is a new TBI, per facility policy.  1432 spoke with patient's daughter; explained that [place] facility is now declining admission for patient.  She is disappointed, but understands.  She chooses [address] as her alternate facility.  Spoke with Chad Gray at [facility] regarding potential admission;  recent clinical information, as admissions liaison could not find patient on the hub.  She will notify me if they are able to take patient.  Expected Discharge Plan: Skilled Nursing Facility Barriers to Discharge: English as a second language teacher  Expected Discharge Plan and Services Expected Discharge Plan: Skilled Nursing Facility   Discharge Planning Services: CM Consult Post Acute Care Choice: Skilled Nursing Facility Living arrangements for the past 2 months: Single  Family Home                                       Social Determinants of Health (SDOH) Interventions    Readmission Risk Interventions No flowsheet data found.  Chad Baton, RN, BSN  Trauma/Neuro ICU Case Manager [phone]
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
## 2020-06-22 MED ORDER — CLONAZEPAM 0.5 MG PO TABS: 0.5000 mg | ORAL_TABLET | Freq: Two times a day (BID) | ORAL | 0 refills | Status: AC

## 2020-06-22 MED ORDER — OXYCODONE HCL 5 MG PO TABS: 5.0000 mg | ORAL_TABLET | Freq: Four times a day (QID) | ORAL | 0 refills | Status: AC | PRN

## 2020-06-22 MED ORDER — QUETIAPINE FUMARATE 100 MG PO TABS
100.0000 mg | ORAL_TABLET | Freq: Every day | ORAL | 0 refills | Status: DC
Start: 2020-06-22 — End: 2020-06-28

## 2020-06-22 MED ORDER — POLYETHYLENE GLYCOL 3350 17 G PO PACK: 17.0000 g | PACK | Freq: Every day | ORAL | 0 refills | Status: AC

## 2020-06-22 MED ORDER — LORAZEPAM 1 MG PO TABS
1.0000 mg | ORAL_TABLET | Freq: Two times a day (BID) | ORAL | Status: DC | PRN
  Administered 2020-06-22: 1 mg via ORAL
  Filled 2020-06-22: qty 1

## 2020-06-22 NOTE — Discharge Instructions (Signed)
Please remove staples from head on 06/24/20!   Craniotomy, Care After This sheet gives you information about how to care for yourself after your procedure. Your doctor may also give you more specific instructions. If you have problems or questions, contact your doctor. Follow these instructions at home: Wound care   Follow instructions from your doctor about how to take care of your cut from surgery (incision) and pin insertion areas. Make sure you: ? Wash your hands with soap and water before you change your bandage (dressing). If you cannot use soap and water, use hand sanitizer. ? Change your bandage as told by your doctor. ? Leave stitches (sutures), skin glue, or skin tape (adhesive) strips in place. They may need to stay in place for 2 weeks or longer. If tape strips get loose and curl up, you may trim the loose edges. Do not remove tape strips completely unless your doctor says it is okay.  Check your cut area and pin insertion sites every day for signs of infection. Check for: ? Redness, swelling, or pain. ? Fluid or blood. ? Warmth. ? Pus or a bad smell.  Do not take baths, swim, or use a hot tub until your doctor says it is okay. Activity  Limit your activities as told by your doctor. You may then slowly increase your activities as told by your doctor. Avoid contact sports for 1 year or as told by your doctor.  Do not drive until your doctor says it is okay.  Rest and sleep as much as possible. When you lie down, keep your head raised (elevated) at a 30-degree angle. You can do this with some pillows. This helps keep brain swelling down and prevents increased pressure in the head. Ask your doctor when you can go back to sleeping flat.  Ask your doctor when you can go back to work. General instructions   Wear a helmet as told by your doctor.  Do not drink alcohol.  Take over-the-counter and prescription medicines only as told by your doctor.  To prevent or treat  constipation while you are taking prescription pain medicine, your doctor may suggest that you: ? Drink enough fluid to keep your pee (urine) clear or pale yellow. ? Take over-the-counter or prescription medicines. ? Eat foods that are high in fiber. Such foods include:  Fresh fruits.  Fresh vegetables.  Whole grains.  Beans. ? Limit foods that are high in fat and processed sugars, such as fried and sweet foods. Contact a doctor if:  You have redness, swelling, or pain around your cut area or pin insertion areas.  You have fluid or blood coming from your cut area or pin insertion areas.  Your cut area or pin insertion areas feel warm to the touch.  You have pus or a bad smell coming from your cut area or pin insertion areas.  You have a fever. Get help right away if:  You have a very bad headache.  You have uncontrolled shaking or jerking movements (seizures).  You feel confused.  You pass out (faint).  You feel sick to your stomach (nausea) and you throw up (vomit) and it does not stop.  You feel weak or numb on one side of your body.  Your speech is slurred.  You have chest pain, a stiff neck, or trouble breathing.  You have blurry vision or loss of vision.  You have swelling or bruising around the eyes.  Your wound breaks open after the stitches or  staples are taken out.  You have a rash.  You feel dizzy. Summary  Check your cut area and pin insertion areas every day for signs of infection. Signs of infection include redness, swelling, drainage, or fever.  After the procedure, return to activities slowly. Rest when you get tired.  Eat foods that are high in fiber, such as fresh fruits and vegetables, whole grains, and beans. Drink plenty of fluids to avoid constipation after surgery, especially if you are taking pain medicines. This information is not intended to replace advice given to you by your health care provider. Make sure you discuss any questions  you have with your health care provider. Document Revised: 08/15/2017 Document Reviewed: 09/20/2016 Elsevier Patient Education  2020 [employer].

## 2020-06-22 NOTE — Progress Notes (Signed)
OT Cancellation Note  Patient Details Name: Chad Gray MRN: [id number] DOB: Dec 21, 1955   Cancelled Treatment:    Reason Eval/Treat Not Completed: Pt with PT then eating lunch.  Will try back.  Eber Jones., OTR/L Acute Rehabilitation Services Pager [phone] Office [phone]   Jeani Hawking M 06/22/2020, 4:47 PM
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
## 2020-06-23 ENCOUNTER — Encounter (HOSPITAL_COMMUNITY): Payer: Self-pay

## 2020-06-23 ENCOUNTER — Other Ambulatory Visit: Payer: Self-pay

## 2020-06-23 ENCOUNTER — Emergency Department (HOSPITAL_COMMUNITY)
Admission: EM | Admit: 2020-06-23 | Discharge: 2020-06-28 | Disposition: A | Discharge status: Home / Self Care | Payer: Medicare Other | Attending: Emergency Medicine | Admitting: Emergency Medicine

## 2020-06-23 DIAGNOSIS — S0101XD Laceration without foreign body of scalp, subsequent encounter: Secondary | ICD-10-CM | POA: Insufficient documentation

## 2020-06-23 DIAGNOSIS — Z950 Presence of cardiac pacemaker: Secondary | ICD-10-CM | POA: Insufficient documentation

## 2020-06-23 DIAGNOSIS — S069X9A Unspecified intracranial injury with loss of consciousness of unspecified duration, initial encounter: Secondary | ICD-10-CM

## 2020-06-23 DIAGNOSIS — Z4802 Encounter for removal of sutures: Secondary | ICD-10-CM | POA: Insufficient documentation

## 2020-06-23 DIAGNOSIS — W2111XD Struck by baseball bat, subsequent encounter: Secondary | ICD-10-CM | POA: Insufficient documentation

## 2020-06-23 DIAGNOSIS — F6381 Intermittent explosive disorder: Secondary | ICD-10-CM | POA: Diagnosis present

## 2020-06-23 DIAGNOSIS — Z79899 Other long term (current) drug therapy: Secondary | ICD-10-CM | POA: Insufficient documentation

## 2020-06-23 DIAGNOSIS — R410 Disorientation, unspecified: Secondary | ICD-10-CM

## 2020-06-23 DIAGNOSIS — Z20822 Contact with and (suspected) exposure to covid-19: Secondary | ICD-10-CM | POA: Insufficient documentation

## 2020-06-23 DIAGNOSIS — Z87820 Personal history of traumatic brain injury: Secondary | ICD-10-CM | POA: Insufficient documentation

## 2020-06-23 DIAGNOSIS — S069XAA Unspecified intracranial injury with loss of consciousness status unknown, initial encounter: Secondary | ICD-10-CM

## 2020-06-23 DIAGNOSIS — I10 Essential (primary) hypertension: Secondary | ICD-10-CM | POA: Insufficient documentation

## 2020-06-23 LAB — CBC WITH DIFFERENTIAL/PLATELET
Abs Immature Granulocytes: 0.04 10*3/uL (ref 0.00–0.07)
Basophils Absolute: 0 10*3/uL (ref 0.0–0.1)
Basophils Relative: 1 %
Eosinophils Absolute: 0 10*3/uL (ref 0.0–0.5)
Eosinophils Relative: 0 %
HCT: 36.3 % — ABNORMAL LOW (ref 39.0–52.0)
Hemoglobin: 12.3 g/dL — ABNORMAL LOW (ref 13.0–17.0)
Immature Granulocytes: 1 %
Lymphocytes Relative: 27 %
Lymphs Abs: 1.9 10*3/uL (ref 0.7–4.0)
MCH: 35.1 pg — ABNORMAL HIGH (ref 26.0–34.0)
MCHC: 33.9 g/dL (ref 30.0–36.0)
MCV: 103.7 fL — ABNORMAL HIGH (ref 80.0–100.0)
Monocytes Absolute: 0.7 10*3/uL (ref 0.1–1.0)
Monocytes Relative: 10 %
Neutro Abs: 4.4 10*3/uL (ref 1.7–7.7)
Neutrophils Relative %: 61 %
Platelets: 429 10*3/uL — ABNORMAL HIGH (ref 150–400)
RBC: 3.5 MIL/uL — ABNORMAL LOW (ref 4.22–5.81)
RDW: 14.4 % (ref 11.5–15.5)
WBC: 7.2 10*3/uL (ref 4.0–10.5)
nRBC: 0 % (ref 0.0–0.2)

## 2020-06-23 LAB — COMPREHENSIVE METABOLIC PANEL
ALT: 26 U/L (ref 0–44)
AST: 24 U/L (ref 15–41)
Albumin: 4.6 g/dL (ref 3.5–5.0)
Alkaline Phosphatase: 56 U/L (ref 38–126)
Anion gap: 9 (ref 5–15)
BUN: 23 mg/dL (ref 8–23)
CO2: 24 mmol/L (ref 22–32)
Calcium: 9.6 mg/dL (ref 8.9–10.3)
Chloride: 106 mmol/L (ref 98–111)
Creatinine, Ser: 1.03 mg/dL (ref 0.61–1.24)
GFR, Estimated: >60 mL/min (ref >60)
Glucose, Bld: 99 mg/dL (ref 70–99)
Potassium: 3.9 mmol/L (ref 3.5–5.1)
Sodium: 139 mmol/L (ref 135–145)
Total Bilirubin: 0.6 mg/dL (ref 0.3–1.2)
Total Protein: 7.6 g/dL (ref 6.5–8.1)

## 2020-06-23 LAB — CBG MONITORING, ED: Glucose-Capillary: 117 mg/dL — ABNORMAL HIGH (ref 70–99)

## 2020-06-23 LAB — ACETAMINOPHEN LEVEL: Acetaminophen (Tylenol), Serum: <10 ug/mL — ABNORMAL LOW (ref 10–30)

## 2020-06-23 LAB — SALICYLATE LEVEL: Salicylate Lvl: <7 mg/dL — ABNORMAL LOW (ref 7.0–30.0)

## 2020-06-23 LAB — ETHANOL: Alcohol, Ethyl (B): <10 mg/dL (ref <10)

## 2020-06-23 NOTE — ED Notes (Signed)
Pt was given a urine specimen cup to provide a specimen. He failed to provide a specimen while in the bathroom; the cup was empty when he came out.
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
## 2020-06-23 NOTE — Progress Notes (Signed)
PTAR at bedside to take patient to [facility], attempted to call report and left direct phone number with secretary for RN Shanda Bumps to call back for report as RN was not available to take report

## 2020-06-23 NOTE — ED Triage Notes (Signed)
Pt coming from [place] and arrives by [facility] after attempting to jump out of window. Pt suffers from short term memory loss. IVC paperwork taken out by daughter.
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
## 2020-06-23 NOTE — TOC Transition Note (Signed)
Transition of Care [facility]) - CM/SW Discharge Note   Patient Details  Name: Chad Gray MRN: [id number] Date of Birth: 12-19-1955  Transition [facility] CM/SW Contact:  Glennon Mac, RN Phone Number: 06/23/2020, 12:09 PM   Clinical Narrative: Per Trish Mage in admissions at [facility], insurance authorization has received for admission today. Pt to dc to room 119 B, and room is ready.  Notified pt and daughter of transfer.  Bedside nurse to call report to [phone].      Final next level of care: Skilled Nursing Facility Barriers to Discharge: Barriers Resolved   Patient Goals and CMS Choice   CMS Medicare.gov Compare Post Acute Care list provided to:: Patient Represenative (must comment) (daughter) Choice offered to / list presented to : Adult Children  Discharge Placement PASRR number recieved: 06/17/20            Patient chooses bed at: Other - please specify in the comment section below: [facility]) Patient to be transferred to facility by: PTAR notified at 1026 Name of family member notified: [name], daughter Patient and family notified of of transfer: 06/23/20  Discharge Plan and Services   Discharge Planning Services: CM Consult Post Acute Care Choice: Skilled Nursing Facility                               Social Determinants of Health (SDOH) Interventions     Readmission Risk Interventions No flowsheet data found.  Quintella Baton, RN, BSN  Trauma/Neuro ICU Case Manager [phone]

## 2020-06-24 ENCOUNTER — Encounter (HOSPITAL_COMMUNITY): Payer: Self-pay | Admitting: Emergency Medicine

## 2020-06-24 DIAGNOSIS — F6381 Intermittent explosive disorder: Secondary | ICD-10-CM | POA: Diagnosis present

## 2020-06-24 LAB — RAPID URINE DRUG SCREEN, HOSP PERFORMED
Amphetamines: NOT DETECTED
Barbiturates: NOT DETECTED
Benzodiazepines: POSITIVE — AB
Cocaine: NOT DETECTED
Opiates: NOT DETECTED
Tetrahydrocannabinol: NOT DETECTED

## 2020-06-24 LAB — RESPIRATORY PANEL BY RT PCR (FLU A&B, COVID)
Influenza A by PCR: NEGATIVE
Influenza B by PCR: NEGATIVE
SARS Coronavirus 2 by RT PCR: NEGATIVE

## 2020-06-24 MED ORDER — ALUM & MAG HYDROXIDE-SIMETH 200-200-20 MG/5ML PO SUSP
30.0000 mL | Freq: Once | ORAL | Status: AC
  Administered 2020-06-24: 30 mL via ORAL
  Filled 2020-06-24: qty 30

## 2020-06-24 MED ORDER — QUETIAPINE FUMARATE 25 MG PO TABS
12.5000 mg | ORAL_TABLET | Freq: Every day | ORAL | Status: DC
  Administered 2020-06-24 – 2020-06-28 (×4): 12.5 mg via ORAL
  Filled 2020-06-24 (×4): qty 1

## 2020-06-24 MED ORDER — HALOPERIDOL LACTATE 5 MG/ML IJ SOLN
5.0000 mg | Freq: Once | INTRAMUSCULAR | Status: AC
  Administered 2020-06-24: 5 mg via INTRAMUSCULAR
  Filled 2020-06-24: qty 1

## 2020-06-24 MED ORDER — LORAZEPAM 2 MG/ML IJ SOLN
2.0000 mg | Freq: Once | INTRAMUSCULAR | Status: AC
  Administered 2020-06-24: 2 mg via INTRAMUSCULAR
  Filled 2020-06-24: qty 1

## 2020-06-24 MED ORDER — CARBAMAZEPINE 100 MG PO CHEW
100.0000 mg | CHEWABLE_TABLET | Freq: Three times a day (TID) | ORAL | Status: DC
  Administered 2020-06-24 – 2020-06-28 (×10): 100 mg via ORAL
  Filled 2020-06-24 (×13): qty 1
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
## 2020-06-24 NOTE — ED Notes (Signed)
Pt is currently asleep. No needs or concerns identified at this time

## 2020-06-24 NOTE — BH Assessment (Signed)
TTS attempted to contact IVC petitioner Marrianne Mood, daughter, [phone]. No answer, left HIPAA compliance vm. Pending collateral.   Dolores Frame, MSW, LCSW-A Triage Specialist [phone]

## 2020-06-24 NOTE — ED Notes (Signed)
BREAKFAST TRAY GIVEN 

## 2020-06-24 NOTE — ED Notes (Signed)
TTS in room with patient. 

## 2020-06-24 NOTE — ED Notes (Signed)
Pt began experiencing increasing levels of anxiety and restlessness around 0735. Tech attempted to ask pt what he needed to feel better, pt kept repeating that he needed medicine but no one would give it to him. Tech explained that she was unable to give pt any medication and that his nurse would be taking care of that, but that tech was able to provide a warm blanket and beverage. Pt left his room when tech went to bring him a warm blanket, stating that he was going to leave since "nobody wants to take care of me or listen to me." RN attempted to redirect pt and explained that his meal was coming and that she would be bringing his medications but pt's agitation increased and he walked down the hall out of the dept. RN and tech followed pt, who kept walking until he reached a locked door on the second floor, but pt remained unable to be redirected. RN called for security. Pt came back down the stairs and attempted to leave hospital, but was escorted back to room by security and staff before he could do so. 1:1 sitter and security/[employer] officer at bedside at this time.

## 2020-06-24 NOTE — BH Assessment (Addendum)
Disposition: Per Nira Conn, NP recommend continuous observation.Pending collateral(IVC petitioner Marrianne Mood, daughter, [phone]).  This therapist informed and was acknowledged by Melene Muller, RN of pt's disposition. RN agreed to inform EDP of disposition.   Dolores Frame, MSW, LCSW-A Triage Specialist [phone]

## 2020-06-24 NOTE — ED Notes (Signed)
PT INSIDE ROOM TALKING WITH AN OFFICER.

## 2020-06-24 NOTE — ED Notes (Signed)
Patient has ripped off the cardiac monitor, BP cuff, and pulse ox. Patient is wanting to leave. He is under IVC currently.
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
## 2020-06-24 NOTE — BH Assessment (Signed)
Disposition: Per Nira Conn, NP recommend continuous observation. Pending collateral (IVC petitioner Marrianne Mood, daughter, [phone]).   Dolores Frame, MSW, LCSW-A Triage Specialist [phone]

## 2020-06-25 MED ORDER — LORAZEPAM 2 MG/ML IJ SOLN
2.0000 mg | Freq: Once | INTRAMUSCULAR | Status: AC
  Administered 2020-06-25: 2 mg via INTRAMUSCULAR

## 2020-06-25 MED ORDER — LORAZEPAM 2 MG/ML IJ SOLN
2.0000 mg | Freq: Once | INTRAMUSCULAR | Status: DC
  Filled 2020-06-25: qty 1

## 2020-06-25 MED ORDER — LORAZEPAM 1 MG PO TABS
1.0000 mg | ORAL_TABLET | Freq: Once | ORAL | Status: AC
  Administered 2020-06-25: 1 mg via ORAL
  Filled 2020-06-25: qty 1

## 2020-06-25 MED ORDER — HALOPERIDOL LACTATE 5 MG/ML IJ SOLN
5.0000 mg | Freq: Once | INTRAMUSCULAR | Status: AC
  Administered 2020-06-25: 5 mg via INTRAMUSCULAR
  Filled 2020-06-25: qty 1

## 2020-06-25 NOTE — ED Notes (Signed)
Pt escalating. He is upset because we are not constantly providing him with snacks and coffee. Provider notified.

## 2020-06-25 NOTE — ED Notes (Signed)
Pt has slept for most of the night. Around 5a.m. pt woke up and has been constantly asking for food. Reoriented on numerous occasions. Pt has very minimal short term memory.

## 2020-06-25 NOTE — ED Notes (Signed)
Patient found wandering the halls, redirected by security to his room.

## 2020-06-25 NOTE — Progress Notes (Signed)
Pt meets inpatient criteria per Roselie Skinner, NP. Referral information has been sent to the following hospitals for review:  [facility]  [facility]  [facility]  [facility]  [facility]  [facility]  [facility]  [facility]  [facility]  [facility]  [facility]  [facility]      Disposition will continue to follow for inpatient placement needs.    Wells Guiles, MSW, LCSW, LCAS Clinical Social Worker II Disposition CSW [phone]

## 2020-06-25 NOTE — Consult Note (Addendum)
Attempted to see the patient for telepsych visit.  Was told patient received prn medication for agitation and could not participate in the assessment at this time.

## 2020-06-25 NOTE — ED Notes (Signed)
Pt arrived calm and cooperative and immediately fell back asleep

## 2020-06-25 NOTE — ED Notes (Signed)
Patient refusing vitals at this time, RN aware.

## 2020-06-26 DIAGNOSIS — S069XAA Unspecified intracranial injury with loss of consciousness status unknown, initial encounter: Secondary | ICD-10-CM

## 2020-06-26 DIAGNOSIS — S069X9A Unspecified intracranial injury with loss of consciousness of unspecified duration, initial encounter: Secondary | ICD-10-CM

## 2020-06-26 MED ORDER — LORAZEPAM 1 MG PO TABS
1.0000 mg | ORAL_TABLET | Freq: Four times a day (QID) | ORAL | Status: DC | PRN
  Administered 2020-06-26 – 2020-06-27 (×2): 1 mg via ORAL
  Filled 2020-06-26: qty 1
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
## 2020-06-26 NOTE — ED Provider Notes (Signed)
Emergency Medicine Observation Re-evaluation Note  Chad [name] is a 64 y.o. male, seen on rounds today.  Pt initially presented to the ED for complaints of No chief complaint on file. Currently, the patient is calm and cooperative and will intermittently say he is ready to go to work but not agitated at this time.  Physical Exam  BP 133/85 (BP Location: Right Arm)   Pulse 77   Temp 98.4 F (36.9 C) (Oral)   Resp 16   Ht 5\' 9"  (1.753 m)   Wt 73.1 kg   SpO2 98%   BMI 23.79 kg/m  Physical Exam General: NAD Cardiac: RRR Lungs: No resp distress Psych: tangential and intermittently off topic but cooperative  ED Course / MDM  EKG:EKG Interpretation  Date/Time:  Saturday June 24 2020 08:49:48 EDT Ventricular Rate:  96 PR Interval:    QRS Duration: 95 QT Interval:  400 QTC Calculation: 487 R Axis:   81 Text Interpretation: sinus rhythm Borderline right axis deviation Borderline prolonged QT interval No previous tracing Confirmed by [id number] [phone]) on 06/24/2020 9:03:37 AM    I have reviewed the labs performed to date as well as medications administered while in observation.  Recent changes in the last 24 hours include none.  Plan  Current plan is for currently social work and psych are evaluating.  Pt at some point will need more permament psych placement. Patient is not under full IVC at this time.   08/24/2020, MD 06/26/20 1554

## 2020-06-26 NOTE — Progress Notes (Signed)
..   Transition of Care [facility]) - Emergency Department Mini Assessment   Patient Details  Name: Chad [name] MRN: [id number] Date of Birth: 1956-01-18  Transition of Care [facility]) CM/SW Contact:    Lossie Faes Tarpley-Carter, LCSWA Phone Number: 06/26/2020, 11:57 AM   Clinical Narrative: [facility] CM/CSW consulted with pts daughter/Candace Small.  Candace gave CM/CSW permission to fax out pts information for SNF placement.  Candace would also like the SNF placement that could offer something long term.    CSW will continue to follow for dc needs.  Demiah Gullickson Tarpley-Carter, MSW, LCSW-A Pronouns:  She, Her, Hers                  [facility] Transitions of CareClinical Social Worker [email or web address] [phone]  ED Mini Assessment: What brought you to the Emergency Department? : IVC  Barriers to Discharge: No Barriers Identified     Means of departure: Police  Interventions which prevented an admission or readmission: SNF Placement    Patient Contact and Communications Key Contact 1: Candace Small   Spoke with: Candace Small Contact Date: 06/26/20,   Contact time: 1150 Contact Phone Number: [phone] Call outcome: Candace would like to her father to go to a SNF.  Patient states their goals for this hospitalization and ongoing recovery are:: Wants to go to SNF to recuperate from TBI.   Choice offered to / list presented to : Adult Children, Patient  Admission diagnosis:  IVC Order Patient Active Problem List   Diagnosis Date Noted  . Intermittent explosive disorder 06/24/2020   PCP:  Patient, No Pcp Per Pharmacy:  No Pharmacies Listed

## 2020-06-26 NOTE — BH Assessment (Signed)
[facility] Assessment Progress Note  Per Shuvon Rankin, NP, this pt does not require psychiatric hospitalization at this time.  Pt presents under IVC initiated by pt's daughter, however, a First Examination was not completed within 24 hours of pt's arrival, and IVC expired.  Behavioral health referrals are not indicated for pt at this time.  A social work consult has been ordered to facilitate pt's return to the community.  Pt's nurse, Waynetta Sandy, has been notified.  Doylene Canning, MA Triage Specialist [phone]
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
## 2020-06-26 NOTE — ED Notes (Signed)
Pt alert this shift. Pt confused. Tangential . Loose association. Short term memory difficulty, repeats same questions. Medication compliant this shift. Pt redirectable.

## 2020-06-26 NOTE — ED Notes (Signed)
Pt coming out of room to the nurses station multiple times asking for coffee. Pt becoming agitated stating "I have to get out of here to go to work." Pt able to be redirected at this time.

## 2020-06-27 ENCOUNTER — Encounter (HOSPITAL_COMMUNITY): Payer: Self-pay | Admitting: Emergency Medicine
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
## 2020-06-27 NOTE — ED Notes (Signed)
Patient remains confused, continues to speak of a truck coming that he needs to load, had to be redirected multiple times during shift, prn given, possible to today pending SW consult.

## 2020-06-27 NOTE — Progress Notes (Signed)
This chaplain received a phone call and request from the Pt. daughter-Candace for assistance in completing guardianship papers.  The chaplain understands Candace's request was  recommended by the Pt. SW-Rikita.  The chaplain listened as Candace explained the Pt. previous hospitalizations/occurrences and her request. The chaplain learned the Pt. is at [employer] not at [facility].  The chaplain left a VM with chaplain-MS for F/U.

## 2020-06-27 NOTE — ED Notes (Signed)
Patient out in hallway rambling, not making any complete coherent statements, able to redirected briefly then continues on.

## 2020-06-27 NOTE — Progress Notes (Addendum)
2nd shift ED CSW received a handoff from the 1st shift [facility] ED CSW that PT was to see pot.  PT saw pt and recommended no PT folow up, thus pt is NOT appropriate for SNF placement, per PT.  CSW will continue to follow for D/C needs.  Chad Gray. Celise Bazar  MSW, LCSW, LCAS, CCS Transitions of Care Clinical Social Worker Care Coordination Department Ph: [phone]

## 2020-06-27 NOTE — ED Notes (Addendum)
Through secure messaging this writer broached the fact that pt has had many staples in his head since 9/22 with Dr. Jeraldine Loots. No response at this time on this subject.

## 2020-06-28 MED ORDER — CARBAMAZEPINE 100 MG PO CHEW: 100.0000 mg | CHEWABLE_TABLET | Freq: Three times a day (TID) | ORAL | 0 refills | Status: AC

## 2020-06-28 MED ORDER — QUETIAPINE FUMARATE 25 MG PO TABS: 12.5000 mg | ORAL_TABLET | Freq: Every day | ORAL | 0 refills | Status: AC
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
## 2020-06-28 NOTE — ED Notes (Signed)
Pt discharged safely with Daughter.  All belongings were returned.  Patient was in no distress.  RX and discharge instructions were reviewed.

## 2020-06-28 NOTE — ED Provider Notes (Signed)
.  Suture Removal  Date/Time: 06/28/2020 1:07 PM Performed by: Tilden Fossa, MD Authorized by: Tilden Fossa, MD   Consent:    Consent obtained:  Verbal   Consent given by:  Patient and guardian   Risks discussed:  Bleeding and pain Location:    Location:  Head/neck   Head/neck location:  Scalp Procedure details:    Wound appearance:  No signs of infection and good wound healing   Number of staples removed:  44 Post-procedure details:    Post-removal:  No dressing applied   Patient tolerance of procedure:  Tolerated well, no immediate complications   plan to discharge patient home in care of daughter. Staples removed. Wound appears to be healing well.   Tilden Fossa, MD 06/28/20 1308

## 2020-06-28 NOTE — ED Notes (Addendum)
Patient behavior much better controlled overnight, became agitated 1x during shift inquiring about when he was going to go home and needing to talk to his daughter, dispo pending today, Daughter to come after 1130am

## 2021-11-29 IMAGING — CT CT HEAD W/O CM
2 of 4 series · 11 of 47 positions shown, 13 images · non-contrast
Comparison: 06/07/2020

CLINICAL DATA: Follow-up left epidural hematoma

EXAM:
CT HEAD WITHOUT CONTRAST
TECHNIQUE: Contiguous axial images were obtained from the base of the skull
through the vertex without intravenous contrast.

[Series 5: cor soft · coronal · 0.32mm/px · 3 of 73 slices shown]
[im 25/73  brain]
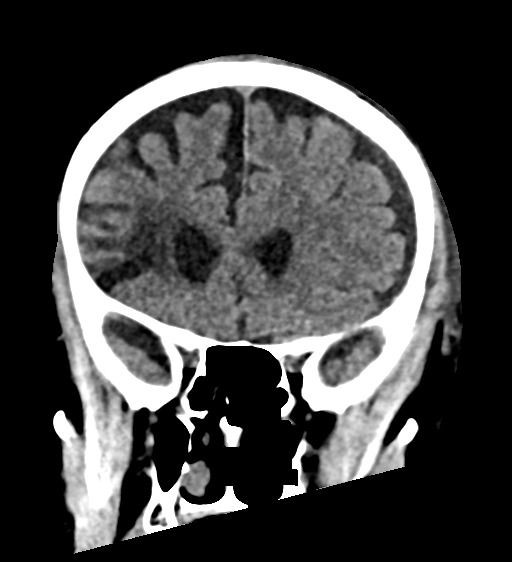
[im 33/73  brain]
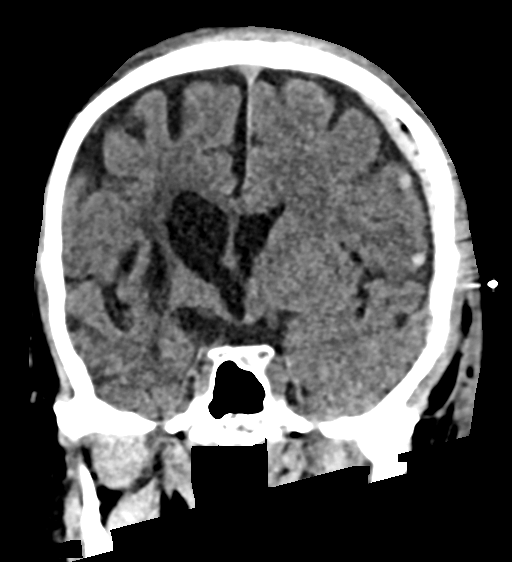
[im 41/73  brain]
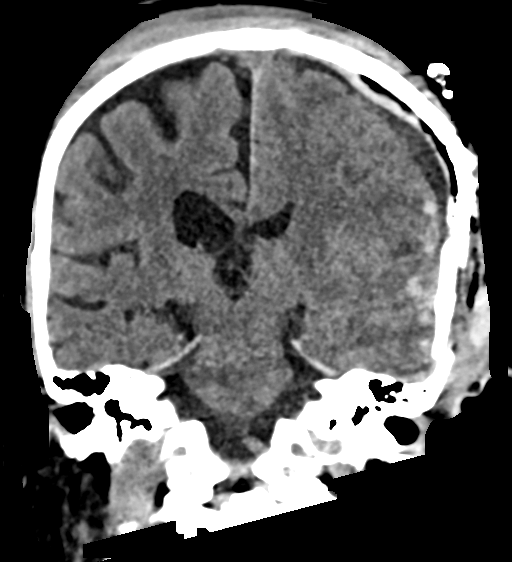

[Series 7: axial soft true · axial · 0.32mm/px · z∈[-286,-136]mm · 8 of 61 slices shown, 10 images]
[im 5/61  brain]
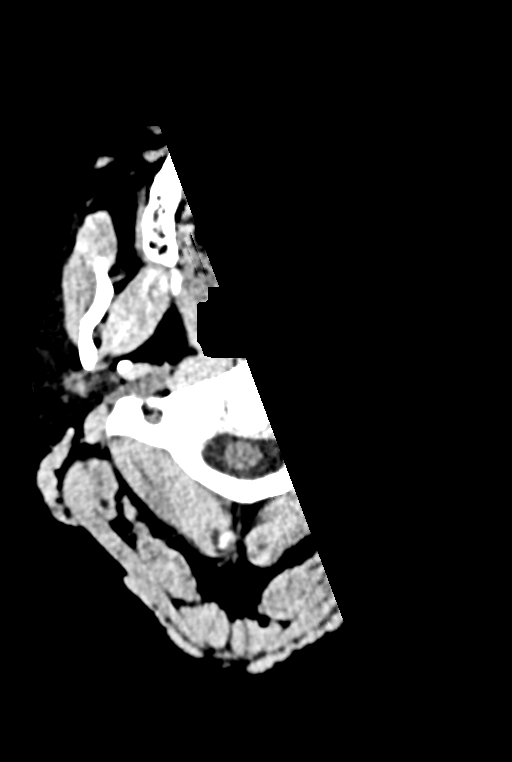
[im 5/61  bone]
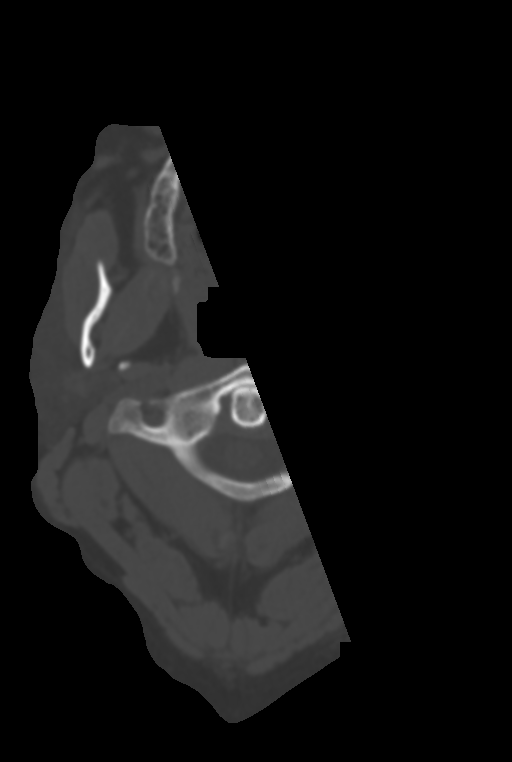
[im 13/61  brain]
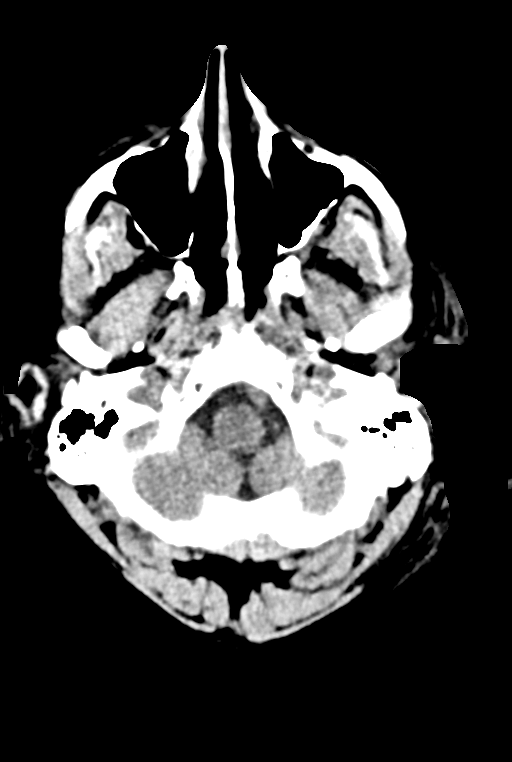
[im 21/61  brain]
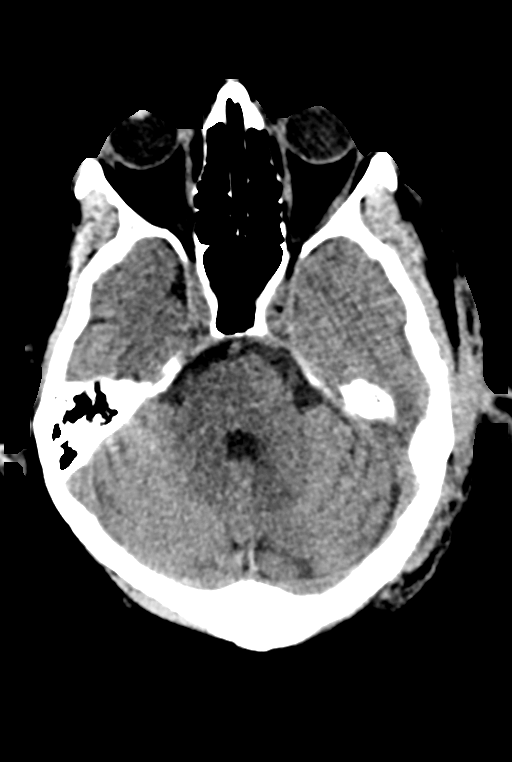
[im 29/61  brain]
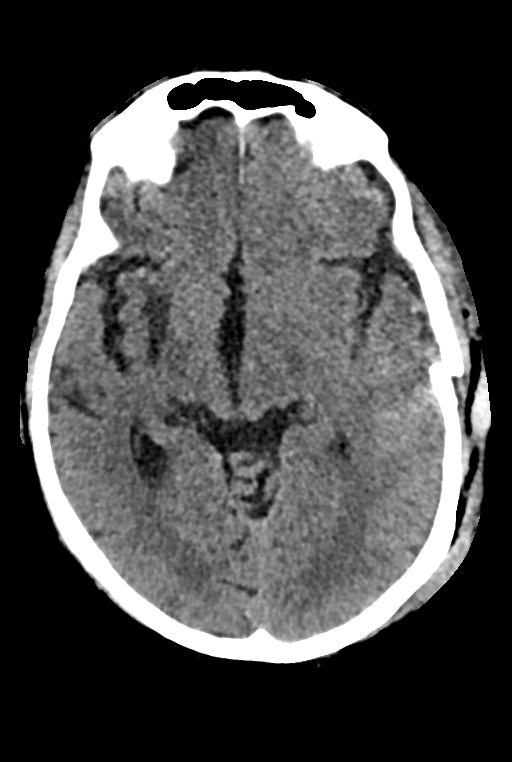
[im 33/61  brain]
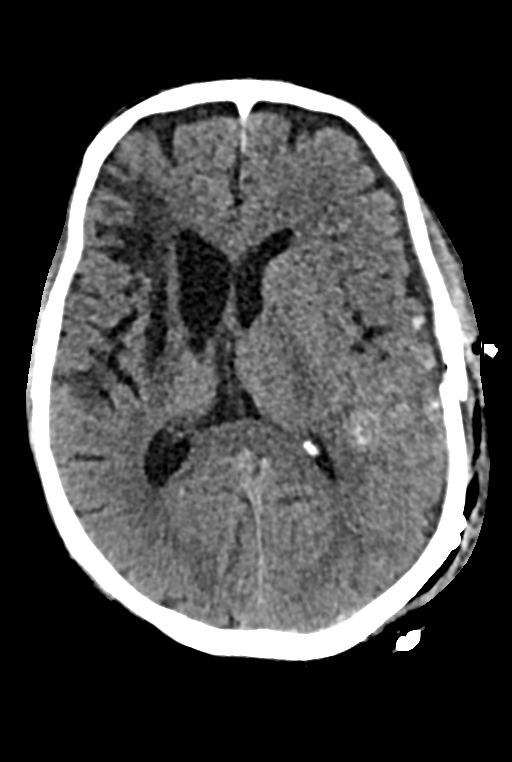
[im 33/61  bone]
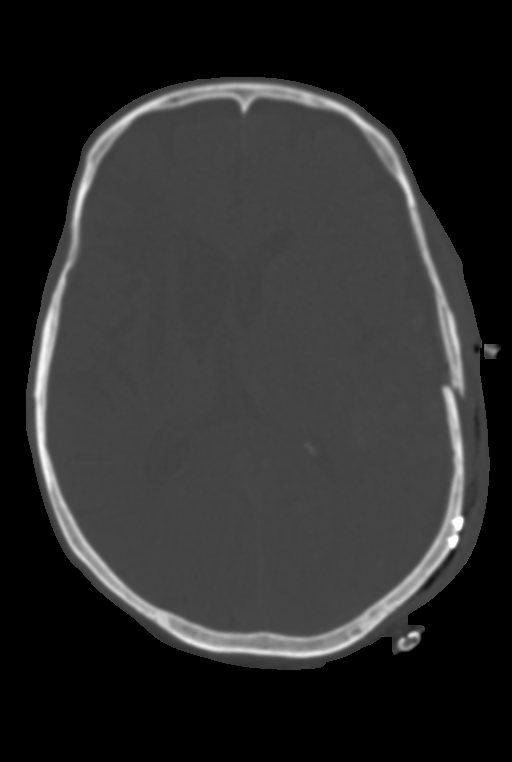
[im 41/61  brain]
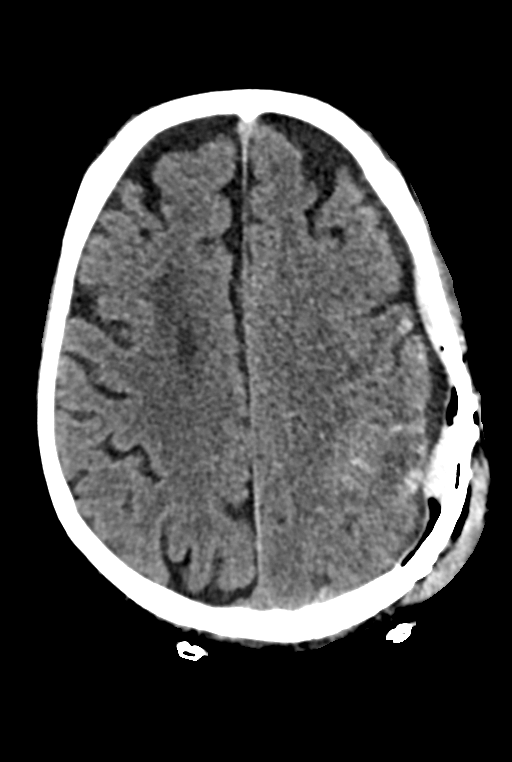
[im 49/61  brain]
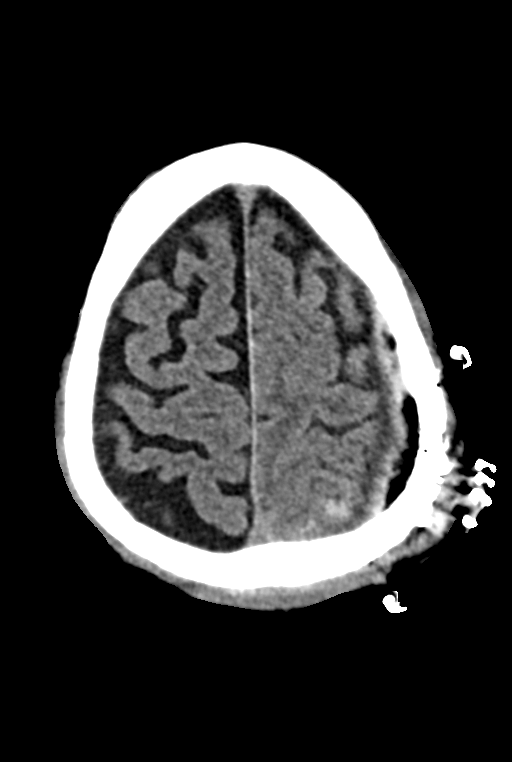
[im 57/61  brain]
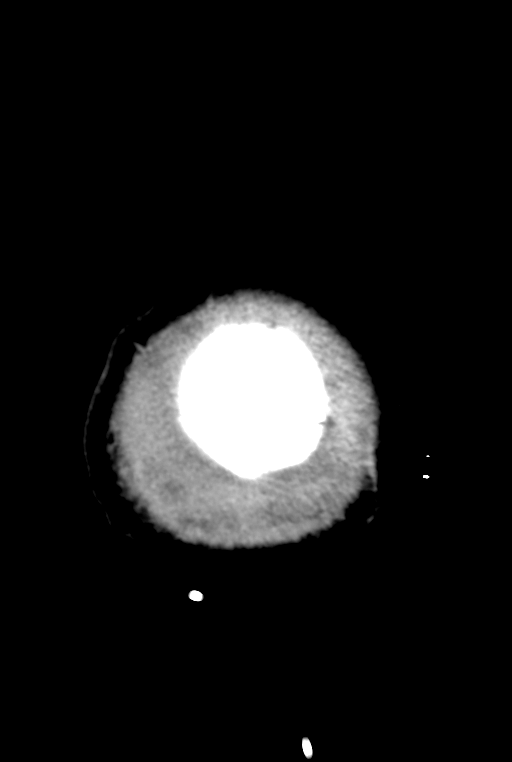

[11 of 47 positions shown; findings below may reference images not displayed]

FINDINGS: Brain: The previously seen epidural hematoma has been evacuated.
Areas of parenchymal contusion as well as subarachnoid hemorrhage
are again seen in the left posterior parietal region similar to that
noted on the prior exam. The degree of subarachnoid hemorrhage has
improved somewhat in the interval from the prior exam. There remains
a mild degree of midline shift of approximately 3-4 mm. Old ischemic
changes noted in the distribution of the right MCA. Chronic right
frontal infarct is noted as well.

Vascular: No hyperdense vessel or unexpected calcification.

Skull: Left parietal bone fracture is again identified in addition
to extension superiorly and across the midline stable from the prior
exam. Interval craniotomy changes are noted.

Sinuses/Orbits: No acute finding.

Other: None.
IMPRESSION: Status post evacuation of the known left epidural hematoma with
significant reduction in the degree of midline shift now measuring
3-4 mm from left to right.

Changes of interval craniotomy related to the hematoma evacuation.

Areas of parenchymal contusion and subarachnoid hemorrhage are noted
on the left slightly improved when compared with the prior exam.

Stable fractures

## 2021-12-03 IMAGING — DX DG CHEST 1V PORT
1 series · 1 of 1 positions shown · non-contrast
Comparison: June 07, 2020.

CLINICAL DATA: Chest pain.

EXAM:
PORTABLE CHEST 1 VIEW

[chest ap]
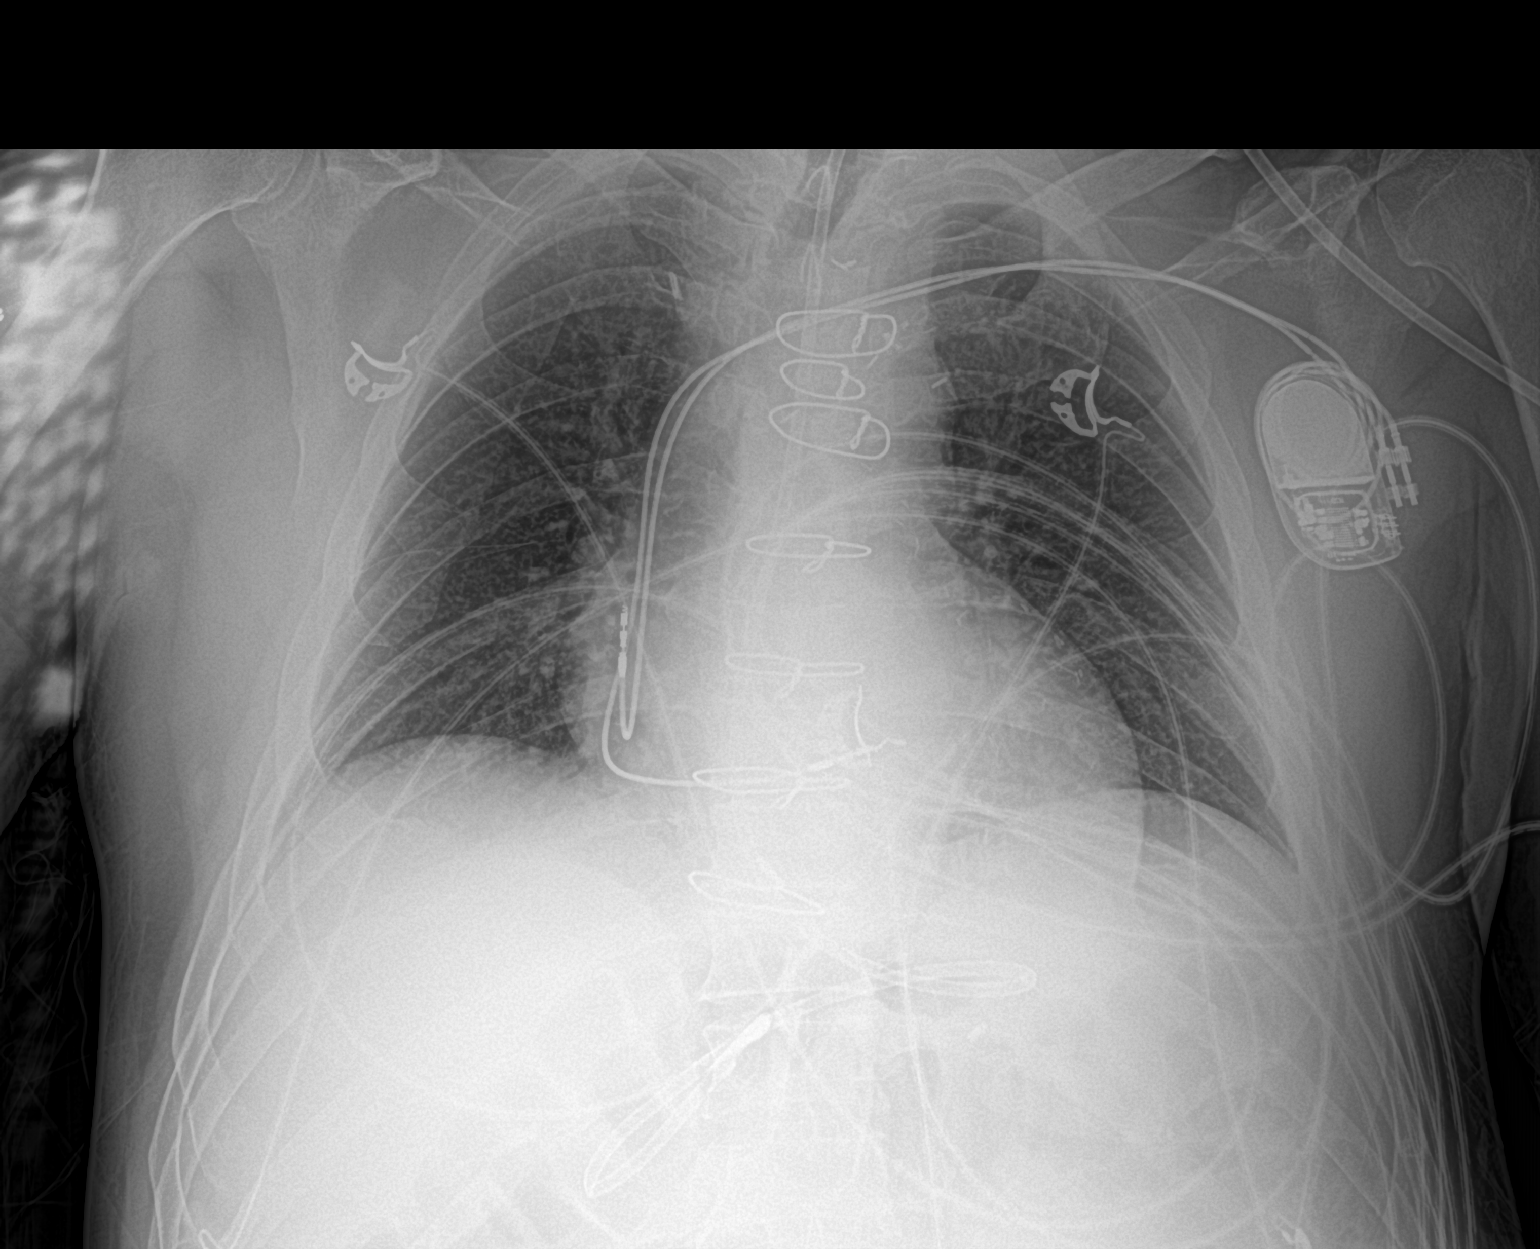

[1 of 1 positions shown; findings below may reference images not displayed]

FINDINGS: Stable cardiomediastinal silhouette. Feeding tube is seen entering
stomach. Left-sided pacemaker is unchanged. No pneumothorax or
pleural effusion is noted. Left lung is clear. Bony thorax is
unremarkable.
IMPRESSION: No active disease.
